# Patient Record
Sex: Female | Born: 1950 | Race: White | Hispanic: No | Marital: Married | Smoking: Never smoker
Health system: Southern US, Community
[De-identification: ages and names within clinical notes are randomized; demographics above are authoritative.]

## PROBLEM LIST (undated history)

## (undated) DIAGNOSIS — E119 Type 2 diabetes mellitus without complications: Secondary | ICD-10-CM

## (undated) DIAGNOSIS — I10 Essential (primary) hypertension: Principal | ICD-10-CM

## (undated) DIAGNOSIS — H269 Unspecified cataract: Secondary | ICD-10-CM

## (undated) DIAGNOSIS — E785 Hyperlipidemia, unspecified: Secondary | ICD-10-CM

## (undated) DIAGNOSIS — H409 Unspecified glaucoma: Secondary | ICD-10-CM

## (undated) HISTORY — DX: Unspecified cataract: H26.9

## (undated) HISTORY — DX: Essential (primary) hypertension: I10

## (undated) HISTORY — DX: Hyperlipidemia, unspecified: E78.5

## (undated) HISTORY — DX: Type 2 diabetes mellitus without complications: E11.9

## (undated) HISTORY — DX: Unspecified glaucoma: H40.9

---

## 2003-08-01 ENCOUNTER — Encounter: Admission: RE | Admit: 2003-08-01 | Discharge: 2003-08-01 | Payer: Self-pay | Admitting: Family Medicine

## 2004-07-03 ENCOUNTER — Ambulatory Visit: Payer: Self-pay | Admitting: Family Medicine

## 2004-07-28 ENCOUNTER — Ambulatory Visit: Payer: Self-pay | Admitting: Sports Medicine

## 2005-07-27 ENCOUNTER — Ambulatory Visit: Payer: Self-pay | Admitting: Family Medicine

## 2006-04-11 ENCOUNTER — Encounter (INDEPENDENT_AMBULATORY_CARE_PROVIDER_SITE_OTHER): Payer: Self-pay | Admitting: *Deleted

## 2006-04-22 ENCOUNTER — Encounter: Payer: Self-pay | Admitting: Family Medicine

## 2006-04-22 ENCOUNTER — Ambulatory Visit: Payer: Self-pay | Admitting: Family Medicine

## 2006-07-13 ENCOUNTER — Ambulatory Visit: Payer: Self-pay | Admitting: Sports Medicine

## 2006-12-10 ENCOUNTER — Encounter (INDEPENDENT_AMBULATORY_CARE_PROVIDER_SITE_OTHER): Payer: Self-pay | Admitting: *Deleted

## 2007-07-27 ENCOUNTER — Ambulatory Visit: Payer: Self-pay | Admitting: Family Medicine

## 2008-07-03 ENCOUNTER — Ambulatory Visit: Payer: Self-pay | Admitting: Family Medicine

## 2008-09-13 ENCOUNTER — Ambulatory Visit: Payer: Self-pay | Admitting: Family Medicine

## 2008-09-18 ENCOUNTER — Telehealth (INDEPENDENT_AMBULATORY_CARE_PROVIDER_SITE_OTHER): Payer: Self-pay | Admitting: Family Medicine

## 2009-05-30 ENCOUNTER — Telehealth: Payer: Self-pay | Admitting: Family Medicine

## 2009-05-30 ENCOUNTER — Ambulatory Visit: Payer: Self-pay | Admitting: Family Medicine

## 2009-05-30 DIAGNOSIS — I10 Essential (primary) hypertension: Secondary | ICD-10-CM

## 2009-05-30 DIAGNOSIS — I152 Hypertension secondary to endocrine disorders: Secondary | ICD-10-CM | POA: Insufficient documentation

## 2009-05-30 HISTORY — DX: Essential (primary) hypertension: I10

## 2009-07-17 ENCOUNTER — Ambulatory Visit: Payer: Self-pay | Admitting: Family Medicine

## 2009-07-29 ENCOUNTER — Telehealth: Payer: Self-pay | Admitting: Family Medicine

## 2009-07-30 ENCOUNTER — Ambulatory Visit: Payer: Self-pay | Admitting: Family Medicine

## 2010-07-18 ENCOUNTER — Ambulatory Visit: Payer: Self-pay | Admitting: Family Medicine

## 2010-11-13 NOTE — Assessment & Plan Note (Signed)
Summary: FLU SHOT/[facility]  Nurse Visit   Vital Signs:  Patient profile:   60 year old female Temp:     85 degrees F  Vitals Entered By: Theresia Lo RN (July 18, 2010 10:53 AM)  Allergies: 1)  ! Neomycin  Immunizations Administered:  Influenza Vaccine # 1:    Vaccine Type: Fluvax 3+    Site: left deltoid    Mfr: GlaxoSmithKline    Dose: 0.5 ml    Route: IM    Given by: Theresia Lo RN    Exp. Date: 04/08/2011    Lot #: [id number]    VIS given: 05/06/10 version given July 18, 2010.  Flu Vaccine Consent Questions:    Do you have a history of severe allergic reactions to this vaccine? no    Any prior history of allergic reactions to egg and/or gelatin? no    Do you have a sensitivity to the preservative Thimersol? no    Do you have a past history of Guillan-Barre Syndrome? no    Do you currently have an acute febrile illness? no    Have you ever had a severe reaction to latex? no    Vaccine information given and explained to patient? yes    Are you currently pregnant? no  Orders Added: 1)  Flu Vaccine 56yrs + [90658] 2)  Admin 1st Vaccine [84696]

## 2011-07-17 ENCOUNTER — Ambulatory Visit (INDEPENDENT_AMBULATORY_CARE_PROVIDER_SITE_OTHER): Payer: BC Managed Care – PPO

## 2011-07-17 DIAGNOSIS — Z23 Encounter for immunization: Secondary | ICD-10-CM

## 2012-07-21 ENCOUNTER — Ambulatory Visit (INDEPENDENT_AMBULATORY_CARE_PROVIDER_SITE_OTHER): Payer: BC Managed Care – PPO | Admitting: *Deleted

## 2012-07-21 DIAGNOSIS — Z23 Encounter for immunization: Secondary | ICD-10-CM

## 2012-12-30 ENCOUNTER — Telehealth: Payer: Self-pay | Admitting: Family Medicine

## 2012-12-30 NOTE — Telephone Encounter (Signed)
Patient is calling because when she took her shower and used a new washcloth.  After her shower, she used the bathroom and had burning with urination that she is sure is from the new cloth.  She wants to know what she can use otc to help with the irritation.

## 2012-12-30 NOTE — Telephone Encounter (Signed)
Returned call to patient.  States she used a "new, red" washcloth yesterday.  Washcloth felt "scratchy".  C/o area "where the pee comes out is irritated" when she wipes.  Denies any dysuria, vaginal discharge, bumps, or rash.  Area is not red or inflamed.  Only has pain when she wipes after urinating.  Yesterday area was swollen.  Patient did not use any soap or a washcloth on the area yesterday and today swelling is better.  Area "feels like it itches sometimes."  Patient has been wearing cotton panties.  Advised patient that washcloth may have caused some abrasion/irritation.  Avoid any harsh soaps, pat area dry after urinating, allow exposure to air if possible and irritation should resolve.  Call for appt or go to urgent care if area becomes red/inflamed/hot, or patient starts having dysuria or discharge.  Gaylene Brooks, RN

## 2013-05-15 ENCOUNTER — Other Ambulatory Visit: Payer: Self-pay

## 2013-05-15 DIAGNOSIS — Z1231 Encounter for screening mammogram for malignant neoplasm of breast: Secondary | ICD-10-CM

## 2013-05-29 ENCOUNTER — Ambulatory Visit
Admission: RE | Admit: 2013-05-29 | Discharge: 2013-05-29 | Disposition: A | Discharge status: Home / Self Care | Payer: BC Managed Care – PPO | Source: Ambulatory Visit

## 2013-05-29 DIAGNOSIS — Z1231 Encounter for screening mammogram for malignant neoplasm of breast: Secondary | ICD-10-CM

## 2013-06-01 ENCOUNTER — Encounter: Payer: Self-pay | Admitting: Family Medicine

## 2013-06-01 DIAGNOSIS — Z87898 Personal history of other specified conditions: Secondary | ICD-10-CM

## 2013-06-01 HISTORY — DX: Personal history of other specified conditions: Z87.898

## 2013-06-05 ENCOUNTER — Encounter: Payer: Self-pay | Admitting: Family Medicine

## 2013-06-05 ENCOUNTER — Ambulatory Visit (INDEPENDENT_AMBULATORY_CARE_PROVIDER_SITE_OTHER): Payer: BC Managed Care – PPO | Admitting: Family Medicine

## 2013-06-05 ENCOUNTER — Other Ambulatory Visit: Payer: Self-pay | Admitting: Family Medicine

## 2013-06-05 VITALS — BP 176/96 | HR 84 | Temp 98.0°F | Wt 144.0 lb

## 2013-06-05 DIAGNOSIS — R928 Other abnormal and inconclusive findings on diagnostic imaging of breast: Secondary | ICD-10-CM

## 2013-06-05 DIAGNOSIS — R739 Hyperglycemia, unspecified: Secondary | ICD-10-CM

## 2013-06-05 DIAGNOSIS — R7309 Other abnormal glucose: Secondary | ICD-10-CM

## 2013-06-05 DIAGNOSIS — R030 Elevated blood-pressure reading, without diagnosis of hypertension: Secondary | ICD-10-CM

## 2013-06-05 DIAGNOSIS — E785 Hyperlipidemia, unspecified: Secondary | ICD-10-CM

## 2013-06-05 DIAGNOSIS — I10 Essential (primary) hypertension: Secondary | ICD-10-CM

## 2013-06-05 DIAGNOSIS — Z0000 Encounter for general adult medical examination without abnormal findings: Secondary | ICD-10-CM

## 2013-06-05 LAB — BASIC METABOLIC PANEL
BUN: 9 mg/dL (ref 6–23)
Chloride: 99 mEq/L (ref 96–112)
Creat: 0.71 mg/dL (ref 0.50–1.10)
Glucose, Bld: 267 mg/dL — ABNORMAL HIGH (ref 70–99)
Potassium: 4.1 mEq/L (ref 3.5–5.3)

## 2013-06-05 MED ORDER — AMLODIPINE BESYLATE 5 MG PO TABS: 5.0000 mg | ORAL_TABLET | Freq: Every day | ORAL | Status: DC

## 2013-06-05 MED ORDER — INDAPAMIDE 1.25 MG PO TABS: 1.2500 mg | ORAL_TABLET | ORAL | Status: DC

## 2013-06-05 NOTE — Patient Instructions (Addendum)
Your blood pressure is in the Hypertension range. Start two low dose antihypertensive medications: a thiazide diuretic called indapamide and a clacium channel blocker called Amlodipine.  We may need to raise the doses of the medications in the future to reach our goal of BP < 150/90.   Today we are checking your blood sugars, electrolytes, kidney function and cholesterol.   Return in 4 weeks to assess blood pressure control  Keep a blood pressure diary to bring to your next office visit.   Go for a dilated eye exam to look for hypertensive retinopathy.

## 2013-06-06 ENCOUNTER — Telehealth: Payer: Self-pay | Admitting: Family Medicine

## 2013-06-06 ENCOUNTER — Encounter: Payer: Self-pay | Admitting: Family Medicine

## 2013-06-06 DIAGNOSIS — R739 Hyperglycemia, unspecified: Secondary | ICD-10-CM

## 2013-06-06 DIAGNOSIS — E7800 Pure hypercholesterolemia, unspecified: Secondary | ICD-10-CM

## 2013-06-06 DIAGNOSIS — E785 Hyperlipidemia, unspecified: Secondary | ICD-10-CM

## 2013-06-06 DIAGNOSIS — E1169 Type 2 diabetes mellitus with other specified complication: Secondary | ICD-10-CM | POA: Insufficient documentation

## 2013-06-06 DIAGNOSIS — Z0000 Encounter for general adult medical examination without abnormal findings: Secondary | ICD-10-CM | POA: Insufficient documentation

## 2013-06-06 HISTORY — DX: Hyperlipidemia, unspecified: E78.5

## 2013-06-06 MED ORDER — ATORVASTATIN CALCIUM 40 MG PO TABS: 40.0000 mg | ORAL_TABLET | Freq: Every day | ORAL | Status: DC

## 2013-06-06 NOTE — Telephone Encounter (Signed)
I spoke with Stefanie Wagner.  We discussed the elevated LDL and elevated random serum glucose.  She agreed to trial of statin and to coming by [facility] lab for an A1C when she brings her dgt Melissa in for office visit.  Will call in Rx for atorvastatin 40 mg daily and enter order for A1C on 06/15/13.

## 2013-06-06 NOTE — Assessment & Plan Note (Addendum)
New diagnosis. No clinical or historical evidence of end organ damage.  Will check BMET for glucose and GFR.  Will check Lipids to risk stratify for CV events.  Recommendations - DASH diet - Start Indapamide 1.25 mg daily and Start Amlodipine 5 mg daily. - Follow up screening labs. - Will check ECG next visit to look for cardiac end organ changes.  - Start home BP monitoring and bring with her to next office visit.  - Goal BP < 150/90 on average.

## 2013-06-06 NOTE — Assessment & Plan Note (Addendum)
Will attempt to reach patient at home telephone number in order to request she come in for diagnostic A1C lab

## 2013-06-06 NOTE — Progress Notes (Signed)
  Subjective:    Patient ID: Stefanie Wagner, female    DOB: 12/24/1950, 62 y.o.   MRN: [id number]  HPI  Elevated BP   Blood pressure range: not checking at home  Chest pain: no   Dyspnea: no   Claudication: no   Medication compliance: Taking no medications, including OTC medications  Preventitive Healthcare:  Exercise: no, though active   Diet Pattern: no formal plan  Salt Restriction: no  ROS: No polyuria, No polydipsia   Review of Systems See HPI  PMH: History of "White coat" blood pressure elevations           No smoking history      Objective:   Physical Exam  Constitutional: She appears well-developed and well-nourished. No distress (Appears slightly anxious).  HENT:  Right Ear: Hearing, tympanic membrane, external ear and ear canal normal. No decreased hearing is noted.  Left Ear: Hearing, tympanic membrane, external ear and ear canal normal. No decreased hearing is noted.  Neck: Full passive range of motion without pain. Neck supple. Normal carotid pulses, no hepatojugular reflux and no JVD present. Carotid bruit is not present. No mass and no thyromegaly present.  Cardiovascular: Normal rate, regular rhythm, normal heart sounds and normal pulses.   Pulses:      Dorsalis pedis pulses are 2+ on the right side, and 2+ on the left side.  Pulmonary/Chest: Effort normal and breath sounds normal.  Abdominal: Soft. Normal aorta and bowel sounds are normal. There is no hepatosplenomegaly. There is no tenderness.  Lymphadenopathy:    She has no cervical adenopathy.  Neurological: She is alert.  Psychiatric: Her behavior is normal. Thought content normal. Her mood appears anxious (mild). Her speech is not rapid and/or pressured. Cognition and memory are normal.          Assessment & Plan:

## 2013-06-06 NOTE — Assessment & Plan Note (Signed)
Discuss CRC screening, Pap smear screening and Zostavax  At next office visit in 4 weeks.

## 2013-06-15 ENCOUNTER — Other Ambulatory Visit (INDEPENDENT_AMBULATORY_CARE_PROVIDER_SITE_OTHER): Payer: BC Managed Care – PPO

## 2013-06-15 ENCOUNTER — Other Ambulatory Visit (INDEPENDENT_AMBULATORY_CARE_PROVIDER_SITE_OTHER): Payer: BC Managed Care – PPO | Admitting: Family Medicine

## 2013-06-15 ENCOUNTER — Ambulatory Visit (INDEPENDENT_AMBULATORY_CARE_PROVIDER_SITE_OTHER): Payer: BC Managed Care – PPO | Admitting: *Deleted

## 2013-06-15 DIAGNOSIS — R739 Hyperglycemia, unspecified: Secondary | ICD-10-CM

## 2013-06-15 DIAGNOSIS — Z23 Encounter for immunization: Secondary | ICD-10-CM

## 2013-06-15 DIAGNOSIS — R7309 Other abnormal glucose: Secondary | ICD-10-CM

## 2013-06-15 DIAGNOSIS — E119 Type 2 diabetes mellitus without complications: Secondary | ICD-10-CM

## 2013-06-15 DIAGNOSIS — E1165 Type 2 diabetes mellitus with hyperglycemia: Secondary | ICD-10-CM | POA: Insufficient documentation

## 2013-06-15 HISTORY — DX: Type 2 diabetes mellitus without complications: E11.9

## 2013-06-15 LAB — POCT GLYCOSYLATED HEMOGLOBIN (HGB A1C): Hemoglobin A1C: 10.2

## 2013-06-15 NOTE — Progress Notes (Signed)
A1c done = 10.2 % BAJORDAN, MLS

## 2013-06-21 ENCOUNTER — Telehealth: Payer: Self-pay | Admitting: Family Medicine

## 2013-06-21 NOTE — Telephone Encounter (Signed)
Pt informed. Fleeger, Jessica Dawn  

## 2013-06-21 NOTE — Telephone Encounter (Signed)
Patient calls to let Dr. McDiarmid know that she is walking 30 mins everyday except Sundays. Also, she has had her eye exam and there is no damage.

## 2013-06-21 NOTE — Assessment & Plan Note (Signed)
New diagnosis of DMT2.  Pt wants to try lifestyle management initially.  Will recheck A1C in 3 months.  Pt to attend Diabetes and nutrition management in interim.

## 2013-06-21 NOTE — Telephone Encounter (Signed)
Pt also wants to clarify when she is to come back and see MD.  She thinks that it is 2 months but wants to verify. Fleeger, Maryjo Rochester

## 2013-06-21 NOTE — Telephone Encounter (Signed)
Ask Stefanie Wagner to schedule an appointment with McDiarmid in Early December for follow up of her diabetes and hypertension.

## 2013-06-26 ENCOUNTER — Other Ambulatory Visit: Payer: Self-pay | Admitting: Family Medicine

## 2013-06-26 ENCOUNTER — Ambulatory Visit
Admission: RE | Admit: 2013-06-26 | Discharge: 2013-06-26 | Disposition: A | Discharge status: Home / Self Care | Payer: BC Managed Care – PPO | Source: Ambulatory Visit | Attending: Family Medicine | Admitting: Family Medicine

## 2013-06-26 DIAGNOSIS — R928 Other abnormal and inconclusive findings on diagnostic imaging of breast: Secondary | ICD-10-CM

## 2013-06-26 DIAGNOSIS — R921 Mammographic calcification found on diagnostic imaging of breast: Secondary | ICD-10-CM

## 2013-06-30 ENCOUNTER — Ambulatory Visit
Admission: RE | Admit: 2013-06-30 | Discharge: 2013-06-30 | Disposition: A | Discharge status: Home / Self Care | Payer: BC Managed Care – PPO | Source: Ambulatory Visit | Attending: Family Medicine | Admitting: Family Medicine

## 2013-06-30 DIAGNOSIS — R921 Mammographic calcification found on diagnostic imaging of breast: Secondary | ICD-10-CM

## 2013-07-04 ENCOUNTER — Encounter: Payer: BC Managed Care – PPO | Attending: Family Medicine

## 2013-07-04 VITALS — Ht 66.0 in | Wt 136.0 lb

## 2013-07-04 DIAGNOSIS — E119 Type 2 diabetes mellitus without complications: Secondary | ICD-10-CM | POA: Insufficient documentation

## 2013-07-04 DIAGNOSIS — Z713 Dietary counseling and surveillance: Secondary | ICD-10-CM | POA: Insufficient documentation

## 2013-07-06 ENCOUNTER — Ambulatory Visit: Payer: BC Managed Care – PPO

## 2013-07-07 NOTE — Progress Notes (Signed)
  Patient was seen on 07/04/13 for the first of a series of three diabetes self-management courses at [facility]. The following learning objectives were met by the patient during this course:   Defines the role of glucose and insulin  Identifies type of diabetes and pathophysiology  Defines the diagnostic criteria for diabetes and prediabetes  States the risk factors for Type 2 Diabetes  States the symptoms of Type 2 Diabetes  Defines Type 2 Diabetes treatment goals  Defines Type 2 Diabetes treatment options  States the rationale for glucose monitoring  Identifies A1C, glucose targets, and testing times  Identifies proper sharps disposal  Defines the purpose of a diabetes food plan  Identifies carbohydrate food groups  Defines effects of carbohydrate foods on glucose levels  Identifies carbohydrate choices/grams/food labels  States benefits of physical activity and effect on glucose  Review of suggested activity guidelines  Handouts given during class include:  Type 2 Diabetes: Basics Book  My Food Plan Book  Food and Activity Log   Follow-Up Plan: Attend core 2 & 3

## 2013-07-11 NOTE — Progress Notes (Signed)
  Patient was seen on 07/11/13 for the second of a series of three diabetes self-management courses at the [facility]. The following learning objectives were met by the patient during this course:   Explain basic nutrition maintenance and quality assurance  Describe causes, symptoms and treatment of hypoglycemia and hyperglycemia  Explain how to manage diabetes during illness  Describe the importance of good nutrition for health and healthy eating strategies  List strategies to follow meal plan when dining out  Describe the effects of alcohol on glucose and how to use it safely  Describe problem solving skills for day-to-day glucose challenges  Describe strategies to use when treatment plan needs to change  Identify important factors involved in successful weight loss  Describe ways to remain physically active  Describe the impact of regular activity on insulin resistance   Follow-Up Plan: Patient will attend the final class of the ADA Diabetes Self-Care Education.    

## 2013-07-19 ENCOUNTER — Encounter: Payer: BC Managed Care – PPO | Attending: Family Medicine

## 2013-07-19 DIAGNOSIS — Z713 Dietary counseling and surveillance: Secondary | ICD-10-CM | POA: Insufficient documentation

## 2013-07-19 DIAGNOSIS — E119 Type 2 diabetes mellitus without complications: Secondary | ICD-10-CM | POA: Insufficient documentation

## 2013-07-19 NOTE — Progress Notes (Signed)
Patient was seen on 07/19/13 for the third of a series of three diabetes self-management courses at [facility]. The following learning objectives were met by the patient during this course:    Describe how diabetes changes over time   Identify diabetes complications and ways to prevent them   Describe strategies that can promote heart health including lowering blood pressure and cholesterol   Describe strategies to lower dietary fat and sodium in the diet   Identify physical activities that benefit cardiovascular health   Describe role of stress on blood glucose and develop strategies to address psychosocial issues   Evaluate success in meeting personal goal   Describe the belief that they can live successfully with diabetes day to day   Establish 2-3 goals that they will plan to diligently work on until they return for the free 19-month follow-up visit  The following handouts were given in class:  Goal setting handout  Class evaluation form  Low-sodium seasoning tips  Stress management handout  Your patient has established the following 4 month goals for diabetes self-care:  Reduce fat in my diet  To help manage stress, I will read at least 3 times a week  Your patient has identified these potential barriers to change:  Taking care of handicapped daughter  Remembering to take medication  Your patient has identified their diabetes self-care support plan as:  [facility] support group  family   Follow-Up Plan: Patient was offered a 4 month follow-up visit for diabetes self-management education.

## 2013-07-20 ENCOUNTER — Ambulatory Visit: Payer: BC Managed Care – PPO

## 2013-07-27 ENCOUNTER — Ambulatory Visit: Payer: BC Managed Care – PPO

## 2013-09-12 ENCOUNTER — Encounter: Payer: Self-pay | Admitting: Family Medicine

## 2013-09-12 ENCOUNTER — Ambulatory Visit (INDEPENDENT_AMBULATORY_CARE_PROVIDER_SITE_OTHER): Payer: BC Managed Care – PPO | Admitting: Family Medicine

## 2013-09-12 VITALS — BP 142/80 | HR 77 | Ht 66.0 in | Wt 123.5 lb

## 2013-09-12 DIAGNOSIS — I10 Essential (primary) hypertension: Secondary | ICD-10-CM

## 2013-09-12 DIAGNOSIS — E7800 Pure hypercholesterolemia, unspecified: Secondary | ICD-10-CM

## 2013-09-12 DIAGNOSIS — R030 Elevated blood-pressure reading, without diagnosis of hypertension: Secondary | ICD-10-CM

## 2013-09-12 DIAGNOSIS — R928 Other abnormal and inconclusive findings on diagnostic imaging of breast: Secondary | ICD-10-CM

## 2013-09-12 DIAGNOSIS — Z0000 Encounter for general adult medical examination without abnormal findings: Secondary | ICD-10-CM

## 2013-09-12 LAB — POCT GLYCOSYLATED HEMOGLOBIN (HGB A1C): Hemoglobin A1C: 6.2

## 2013-09-12 MED ORDER — ZOSTER VACCINE LIVE 19400 UNT/0.65ML ~~LOC~~ SOLR: 0.6500 mL | Freq: Once | SUBCUTANEOUS | Status: DC

## 2013-09-12 MED ORDER — INDAPAMIDE 1.25 MG PO TABS: 1.2500 mg | ORAL_TABLET | ORAL | Status: DC

## 2013-09-12 MED ORDER — ATORVASTATIN CALCIUM 40 MG PO TABS: 40.0000 mg | ORAL_TABLET | Freq: Every day | ORAL | Status: DC

## 2013-09-12 NOTE — Progress Notes (Signed)
Patient ID: Stefanie Wagner, female   DOB: May 09, 1951, 62 y.o.   MRN: [id number]  Stefanie Wagner is a 62 y.o. female who presents to [facility] today for follow-up of HTN and DM.  Patient reports she is feeling well and has no side effects to her medications.  She feels she has been successful in working with the dietician to exercise more and eat healthier.  The following portions of the patient's history were reviewed and updated as appropriate: allergies, current medications, past medical history, family and social history, and problem list.  Patient is a nonsmoker and does not drink.  Past Medical History  Diagnosis Date  . Essential hypertension, benign 05/30/2009    Qualifier: Diagnosis of  By: McDiarmid MD, Tawanna Cooler    . Hyperlipidemia LDL goal < 100 06/06/2013  . Diabetes mellitus without complication     ROS as above otherwise neg.    Medications reviewed. Current Outpatient Prescriptions  Medication Sig Dispense Refill  . amLODipine (NORVASC) 5 MG tablet Take 1 tablet (5 mg total) by mouth daily.  30 tablet  11  . atorvastatin (LIPITOR) 40 MG tablet Take 1 tablet (40 mg total) by mouth daily.  30 tablet  5  . indapamide (LOZOL) 1.25 MG tablet Take 1 tablet (1.25 mg total) by mouth every morning.  30 tablet  3   No current facility-administered medications for this visit.    Exam:  BP 142/80  Pulse 77  Ht 5\' 6"  (1.676 m)  Wt 123 lb 8 oz (56.019 kg)  BMI 19.94 kg/m2 Gen: Well, NAD HEENT: EOMI,  MMM Lungs: CTABL, no WOB Heart: RRR Abd: NABS, NT, ND Exts: Non edematous BL  LE, warm and well perfused.   No results found for this or any previous visit (from the past 72 hour(s)).  Assessment Stefanie Wagner is a 62 y.o. Female who is controlled with her blood pressure and well controlled with lifestyle modifications for her blood sugars.  Plan HTN: -Continue current medications and diet  DM:  -A1C is now 6.2, plan to continue current lifestyle modifications  Healthcare  maintenance: -Patient declined colonoscopy, was informed about FOBT as well -Meds have been refilled -Plan to follow up with PCP in 6 months -Will mail Zostavax prescription to patient  Attending Addendum  I examined the patient and discussed the assessment and plan with Student Dr. Byrd Hesselbach. I have reviewed the note and agree.  Briefly, 62 yo F f/u for:  1. DM2:  Meds: compliant with high dose statin. No ASA.  Diet: compliant. Completed diabetes eduction classes.  ROS: neg for HA, vision changes, CP, SOB, polydipsia, polyuria, tingling/numbness in extremities Eye: followed by opthalmology has known cataracts (early) and glaucoma.  Foot: done w/in last year  2. HTN: compliant with thiazide diuretic and Norvasc.  ROS: as per above.  Diet: low salt and caffeine.   3. Health care maintenance: patient has discussed pap smear with her PCP and declines additional paps given that her last pap was normal and she is low risk (no STDs, one long term sex partner, nonsmoker, HPV negative). Discussed colon cancer screening, negative family history. No melena, stool changes or hematochezia. Patient declines. Discussed zostavax. Patient will consider. Has hx of neomycin allergy, skin rash only.   BP 142/80  Pulse 77  Ht 5\' 6"  (1.676 m)  Wt 123 lb 8 oz (56.019 kg)  BMI 19.94 kg/m2 General appearance: alert, cooperative and no distress Neck: no adenopathy, no carotid bruit, no JVD, supple,  symmetrical, trachea midline and thyroid not enlarged, symmetric, no tenderness/mass/nodules Lungs: clear to auscultation bilaterally Heart: regular rate and rhythm, S1, S2 normal, no murmur, click, rub or gallop Extremities: extremities normal, atraumatic, no cyanosis or edema    Dessa Phi, MD [facility]

## 2013-09-12 NOTE — Patient Instructions (Addendum)
It was a pleasure to meet you today, Stefanie Wagner.  Great work with lowering your blood sugars, continue the excellent work with your diet and exercise!  We have refilled your medications.  Follow up with Dr. McDiarmid in 6 months.

## 2013-09-13 NOTE — Assessment & Plan Note (Signed)
A: well controlled Meds: compliant P:  Continue current regimen Repeat CMP yearly, next 05/2014.

## 2013-09-13 NOTE — Assessment & Plan Note (Signed)
A: improved with lifestyle modifictions. Asymptomatic.  P:  F/u A1c in 6 months Eye exams per ophthalmologist given known glaucoma and cataracts.  Consider ASA therapy, may not be needed given excellent control  Continue statin  F/u appt in 6 months

## 2013-09-13 NOTE — Assessment & Plan Note (Signed)
zostavax handout given. Patient's mother had shingles so she is aware of disease.

## 2013-09-13 NOTE — Assessment & Plan Note (Signed)
A: Appropriate f/u done as per overview. Benign findings.  P: repeat screening mammogram in one year 8-06/2014

## 2013-09-15 ENCOUNTER — Telehealth: Payer: Self-pay | Admitting: Family Medicine

## 2013-09-15 NOTE — Telephone Encounter (Signed)
Dr Armen Pickup wants her to have a shingles shot Pharmacy: [facility]   [address]

## 2013-09-15 NOTE — Telephone Encounter (Signed)
Please call in authorization for Zostavax to patient's pharmacy.  ICD-9 V04.89 is indication.

## 2013-09-15 NOTE — Telephone Encounter (Signed)
Zostavax called into pharmacy to [facility]. Pt is aware. Irelyn Perfecto,CMA

## 2013-09-27 ENCOUNTER — Telehealth: Payer: Self-pay | Admitting: *Deleted

## 2013-11-21 ENCOUNTER — Telehealth: Payer: Self-pay | Admitting: *Deleted

## 2013-11-21 ENCOUNTER — Ambulatory Visit: Payer: BC Managed Care – PPO | Admitting: *Deleted

## 2014-01-30 NOTE — Telephone Encounter (Signed)
Telephone encounter.

## 2014-01-30 NOTE — Telephone Encounter (Signed)
Phone encounter

## 2014-03-27 ENCOUNTER — Other Ambulatory Visit: Payer: Self-pay | Admitting: *Deleted

## 2014-03-27 DIAGNOSIS — R030 Elevated blood-pressure reading, without diagnosis of hypertension: Secondary | ICD-10-CM

## 2014-03-27 MED ORDER — INDAPAMIDE 1.25 MG PO TABS: 1.2500 mg | ORAL_TABLET | ORAL | Status: DC

## 2014-04-26 ENCOUNTER — Ambulatory Visit (INDEPENDENT_AMBULATORY_CARE_PROVIDER_SITE_OTHER): Payer: BC Managed Care – PPO | Admitting: Family Medicine

## 2014-04-26 ENCOUNTER — Encounter: Payer: Self-pay | Admitting: Family Medicine

## 2014-04-26 VITALS — BP 140/74 | HR 72 | Ht 66.0 in | Wt 123.0 lb

## 2014-04-26 DIAGNOSIS — IMO0001 Reserved for inherently not codable concepts without codable children: Secondary | ICD-10-CM

## 2014-04-26 DIAGNOSIS — E785 Hyperlipidemia, unspecified: Secondary | ICD-10-CM

## 2014-04-26 DIAGNOSIS — Z1159 Encounter for screening for other viral diseases: Secondary | ICD-10-CM

## 2014-04-26 DIAGNOSIS — E1165 Type 2 diabetes mellitus with hyperglycemia: Principal | ICD-10-CM

## 2014-04-26 DIAGNOSIS — H409 Unspecified glaucoma: Secondary | ICD-10-CM

## 2014-04-26 DIAGNOSIS — H269 Unspecified cataract: Secondary | ICD-10-CM

## 2014-04-26 DIAGNOSIS — IMO0002 Reserved for concepts with insufficient information to code with codable children: Secondary | ICD-10-CM

## 2014-04-26 DIAGNOSIS — I10 Essential (primary) hypertension: Secondary | ICD-10-CM

## 2014-04-26 HISTORY — DX: Unspecified cataract: H26.9

## 2014-04-26 HISTORY — DX: Unspecified glaucoma: H40.9

## 2014-04-26 LAB — LDL CHOLESTEROL, DIRECT: LDL DIRECT: 78 mg/dL

## 2014-04-26 LAB — POCT GLYCOSYLATED HEMOGLOBIN (HGB A1C): Hemoglobin A1C: 6.3

## 2014-04-26 NOTE — Patient Instructions (Signed)
Your Blood sugar is under great control.   Your blood pressure is under good control.  Keep taking your blood pressure medications.  Start taking an Aspirin 81 mg (baby) daily to prevent stroke.  We are checking your cholesterol and Hepatitis C antibodies.  Try Replens, a vaginal moisturizer.  It is available over-the-counter at most drug stores.  Consider use of a lubricant, like KY jelly, prior to sexual intercourse to ease penetration.  Apply to opening of vaginal and to penile shaft.   If pain with intercourse continues, Dr Cassey Bacigalupo would like to see you again to due a further evaluation.   Contact Dr Spero GeraldsMichelle Kane to discuss brief couples counseling.  Dyspareunia Dyspareunia is pain during sexual intercourse. It is most common in women, but it also happens in men.  CAUSES  Female The pain from this condition is usually felt when anything is put into the vagina, but any part of the genitals may cause pain during sex. Even sitting or wearing pants can cause pain. Sometimes, a cause cannot be found. Some causes of pain during intercourse are:  Infections of the skin around the vagina.  Vaginal infections, such as a yeast, bacterial, or viral infection.  Vaginismus. This is the inability to have anything put in the vagina even when the woman wants it to happen. There is an automatic muscle contraction and pain. The pain of the muscle contraction can be so severe that intercourse is impossible.  Allergic reaction from spermicides, semen, condoms, scented tampons, soaps, douches, and vaginal sprays.  A fluid-filled sac (cyst) on the Bartholin or Skene glands, located at the opening of the vagina.  Scar tissue in the vagina from a surgically enlarged opening (episiotomy) or tearing after delivering a baby.  Vaginal dryness. This is more common in menopause. The normal secretions of the vagina are decreased. Changes in estrogen levels and increased difficulty becoming aroused can cause  painful sex. Vaginal dryness can also happen when taking birth control pills.  Thinning of the tissue (atrophy) of the vulva and vagina. This makes the area thinner, smaller, unable to stretch to accommodate a penis, and prone to infection and tearing.  Vulvar vestibulitis or vestibulodynia.This is a condition that causes pain involving the area around the entrance to the vagina.The most common cause in young women is birth control pills.Women with low estrogen levels (postmenopausal women) may also experience this.Other causes include allergic reactions, too many nerve endings, skin conditions, and pelvic muscles that cannot relax.  Vulvar dermatoses. This includes skin conditions such as lichen sclerosus and lichen planus.  Lack of foreplay to lubricate the vagina. This can cause vaginal dryness.  Noncancerous tumors (fibroids) in the uterus.  Uterus lining tissue growing outside the uterus (endometriosis).  Pregnancy that starts in the fallopian tube (tubal pregnancy).  Pregnancy or breastfeeding your baby. This can cause vaginal dryness.  A tilting or prolapse of the uterus. Prolapse is when weak and stretched muscles around the uterus allow it to fall into the vagina.  Problems with the ovaries, cysts, or scar tissue. This may be worse with certain sexual positions.  Previous surgeries causing adhesions or scar tissue in the vagina or pelvis.  Bladder and intestinal problems.  Psychological problems (such as depression or anxiety). This may make pain worse.  Negative attitudes about sex, experiencing rape, sexual assault, and misinformation about sex. These issues are often related to some types of pain.  Previous pelvic infection, causing scar tissue in the pelvis and on the  female organs.  Cyst or tumor on the ovary.  Cancer of the female organs.  Certain medicines.  Medical problems such as diabetes, arthritis, or thyroid disease. Female In men, there are many  physical causes of sexual discomfort. Some causes of pain during intercourse are:  Infections of the prostate, bladder, or seminal vesicles. This can cause pain after ejaculation.  An inflamed bladder (interstitial cystitis). This may cause pain from ejaculation.  Gonorrheal infections. This may cause pain during ejaculation.  An inflamed urethra (urethritis) or inflamed prostate (prostatitis). This can make genital stimulation painful or uncomfortable.  Deformities of the penis, such as Peyronie's disease.  A tight foreskin.  Cancer of the female organs.  Psychological problems. This may make pain worse. DIAGNOSIS   Your caregiver will take a history and have you describe where the pain is located (outside the vagina, in the vagina, in the pelvis). You may be asked when you experience pain, such as with penetration or with thrusting.  Following this, your caregiver will do a physical exam. Let your caregiver know if the exam is too painful.  During the final part of the female exam, your caregiver will feel your uterus and ovaries with one hand on the abdomen and one finger in your vagina. This is a pelvic exam.  Blood tests, a Pap test, cultures for infection, an ultrasound test, and X-rays may be done. You may need to see a specialist for female problems (gynecologist).  Your caregiver may do a CT scan, MRI, or laparoscopy. Laparoscopy is a procedure to look into the pelvis with a lighted tube, through a cut (incision) in the abdomen. TREATMENT  Your caregiver can help you determine the best course of treatment. Sometimes, more testing is done. Continue with the suggested testing until your caregiver feels sure about your diagnosis and how to treat it. Sometimes, it is difficult to find the reason for the pain. The search for the cause and treatment can be frustrating. Treatment often takes several weeks to a few months before you notice any improvement. You may also need to avoid  sexual activity until symptoms improve.Continuing to have sex when it hurts can delay healing and actually make the problem worse. The treatment depends on the cause of the pain. Treatment may include:  Medicines such as antibiotics, vaginal or skin creams, hormones, or antidepressants.  Minor or major surgery.  Psychological counseling or group therapy.  Kegel exercises and vaginal dilators to help certain cases of vaginismus (spasms). Do this only if recommended by your caregiver.Kegel exercises can make some problems worse.  Applying lubrication as recommended by your caregiver if you have dryness.  Sex therapy for you and your sex partner. It is common for the pain to continue after the reason for the pain has been treated. Some reasons for this include a conditioned response. This means the person having the pain becomes so familiar with the pain that the pain continues as a response, even though the cause is removed. Sex therapy can help with this problem. HOME CARE INSTRUCTIONS   Follow your caregiver's instructions about taking medicines, tests, counseling, and follow-up treatment.  Do not use scented tampons, douches, vaginal sprays, or soaps.  Use water-based lubricants for dryness. Oil lubricants can cause irritation.  Do not use spermicides or condoms that irritate you.  Openly discuss with your partner your sexual experience, your desires, foreplay, and different sexual positions for a more comfortable and enjoyable sexual relationship.  Join group sessions for therapy,  if needed.  Practice safe sex at all times.  Empty your bladder before having intercourse.  Try different positions during sexual intercourse.  Take over-the-counter pain medicine recommended by your caregiver before having sexual intercourse.  Do not wear pantyhose. Knee-high and thigh-high hose are okay.  Avoid scrubbing your vulva with a washcloth. Wash the area gently and pat dry with a  towel. SEEK MEDICAL CARE IF:   You develop vaginal bleeding after sexual intercourse.  You develop a lump at the opening of your vagina, even if it is not painful.  You have abnormal vaginal discharge.  You have vaginal dryness.  You have itching or irritation of the vulva or vagina.  You develop a rash or reaction to your medicine. SEEK IMMEDIATE MEDICAL CARE IF:   You develop severe abdominal pain during or shortly after sexual intercourse. You could have a ruptured ovarian cyst or ruptured tubal pregnancy.  You have a fever.  You have painful or bloody urination.  You have painful sexual intercourse, and you never had it before.  You pass out after having sexual intercourse. Document Released: 10/18/2007 Document Revised: 12/21/2011 Document Reviewed: 12/29/2010 [facility] Patient Information 2015 [place], [place]. This information is not intended to replace advice given to you by your health care provider. Make sure you discuss any questions you have with your health care provider.

## 2014-04-27 ENCOUNTER — Encounter: Payer: Self-pay | Admitting: Family Medicine

## 2014-04-27 DIAGNOSIS — IMO0002 Reserved for concepts with insufficient information to code with codable children: Secondary | ICD-10-CM | POA: Insufficient documentation

## 2014-04-27 LAB — HEPATITIS C ANTIBODY: HCV AB: NEGATIVE

## 2014-04-27 NOTE — Progress Notes (Signed)
   Subjective:    Patient ID: Stefanie Wagner, female    DOB: 26-Sep-1951, 63 y.o.   MRN: [id number] Patient is accompanied by her husband for History and physical. Patient and EMR are sources of information for the visit.  HPI HYPERTENSION  Disease Monitoring: Blood pressure range-not checking at home Chest pain- no      Dyspnea- no  Medications: Compliance- taking amlodipine and Indapamide as recommended Lightheadedness- no   Edema- no   DIABETES  Disease Monitoring: Blood Sugar ranges- in low 100s fasting Polyuria- no New Visual problems- Patient diagnosed with glaucoma and early cataracts by Dr Harlon FlorWhitaker (ophth).  She saw him in 10/2013.  To see again next month.   No numbness in feet.  No     HYPERLIPIDEMIA  Disease Monitoring: See symptoms for Hypertension  Medications: Compliance- yes, takinge atorvastatin as recommended RUQ pain- no  Muscle aches- no  Dyspareunia - Onset several years ago, after menopause in her mid-50s - No history of abnormal paps.  Has had only one sexual partner, her current husband.  - Pt and partner have refrained from intercourse because of pt's pain  - Both pain with initial penile penetration and during thrusting. - Pt does not feel that she  - No vaginal discharge.  No dysuria.  - No vaginal bleeding. No pain without sexual intercourse.  - Pain is severe enough to stop intercourse.   - Pt reports decreased lower genital secretion around sexual relations. - No use of lubricants around sexual relations - Pt perceives avoidance of intercourse has been a relationship strain in her marriage.  - She would like to resume sexual intercourse, if possible.    ROS See HPI above   PMH Smoking Status noted    Review of Systems See HPI      Objective:   Physical Exam VS reviewed GEN: Alert, Cooperative, Groomed, NAD COR: RRR, No M/G/R, No JVD, Normal PMI size and location LUNGS: BCTA, No Acc mm use, speaking in full sentences  EXT: No  peripheral leg edema. Feet without deformity or lesions. Palpable bilateral pedal pulses.  Diabetic Foot Check -  Appearance - no lesions, ulcers or calluses Skin - no unusual pallor or redness  Diabetic Foot exam Monofilament testing -  Right - Great toe, medial, central, lateral ball and posterior foot intact Left - Great toe, medial, central, lateral ball and posterior foot intact Neuro: Oriented to person, place, and time; Strength: 5/5 Bil. UE and LE symmetric; Sensation: Intact grossly to touch all four extremities;  Gait: Normal speed, No significant path deviation, Step through +,  Psych: Normal affect/thought/speech/language       Assessment & Plan:

## 2014-04-27 NOTE — Assessment & Plan Note (Signed)
Adequate lipid control. Tolerating medications.  No new end-organ damage.  Continue current medications, atorvastatin 40 mg daily

## 2014-04-27 NOTE — Assessment & Plan Note (Signed)
New problem. Currently, suspect dyspareunia related to menopausal and age related decline in vaginal secretion with sexual relations.  Recommend trial of vaginal mucosa emollient, e.g. Replens, and use of water-based lubricant applied to introitus prior to intercourse. If problem persists, will need to perform a pelvic exam.

## 2014-04-27 NOTE — Assessment & Plan Note (Signed)
Adequate glycemic control with lifestyle interventions alone.  No new end-organ damage.  Continue lifestyle management.

## 2014-04-27 NOTE — Assessment & Plan Note (Signed)
Adequate blood pressure control.  No evidence of new end organ damage.  Tolerating medication without significant adverse effects.  Plan to continue current blood pressure regiment.   

## 2014-05-22 ENCOUNTER — Other Ambulatory Visit: Payer: Self-pay

## 2014-05-22 DIAGNOSIS — Z1231 Encounter for screening mammogram for malignant neoplasm of breast: Secondary | ICD-10-CM

## 2014-05-23 ENCOUNTER — Ambulatory Visit (INDEPENDENT_AMBULATORY_CARE_PROVIDER_SITE_OTHER): Payer: BC Managed Care – PPO | Admitting: *Deleted

## 2014-05-23 ENCOUNTER — Other Ambulatory Visit: Payer: Self-pay | Admitting: Family Medicine

## 2014-05-23 DIAGNOSIS — Z23 Encounter for immunization: Secondary | ICD-10-CM

## 2014-05-30 ENCOUNTER — Ambulatory Visit
Admission: RE | Admit: 2014-05-30 | Discharge: 2014-05-30 | Disposition: A | Discharge status: Home / Self Care | Payer: BC Managed Care – PPO | Source: Ambulatory Visit

## 2014-05-30 ENCOUNTER — Telehealth: Payer: Self-pay | Admitting: Family Medicine

## 2014-05-30 DIAGNOSIS — Z1231 Encounter for screening mammogram for malignant neoplasm of breast: Secondary | ICD-10-CM

## 2014-05-30 NOTE — Telephone Encounter (Signed)
Pt called and would like another prescription for her shingle shot. She misplaced the other one. Can we fax this too [phone] which is her personal fax.jw

## 2014-05-30 NOTE — Telephone Encounter (Signed)
Pt would like another prescription for a shingle shot. She has misplaced the original. Please fax this to her home at [phone]. Myriam Jacobsonjw

## 2014-06-01 NOTE — Telephone Encounter (Signed)
Pt informed

## 2014-06-01 NOTE — Telephone Encounter (Signed)
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    The manufacturing of Zostavax uses Neomycin in the process. Neomycin allergy is a contraindication to receiving the Zostavax vaccination.

## 2014-06-19 ENCOUNTER — Ambulatory Visit (INDEPENDENT_AMBULATORY_CARE_PROVIDER_SITE_OTHER): Payer: BC Managed Care – PPO | Admitting: *Deleted

## 2014-06-19 DIAGNOSIS — Z23 Encounter for immunization: Secondary | ICD-10-CM

## 2014-06-21 ENCOUNTER — Other Ambulatory Visit: Payer: Self-pay | Admitting: Family Medicine

## 2014-09-27 ENCOUNTER — Ambulatory Visit (INDEPENDENT_AMBULATORY_CARE_PROVIDER_SITE_OTHER): Payer: BC Managed Care – PPO | Admitting: Family Medicine

## 2014-09-27 ENCOUNTER — Encounter: Payer: Self-pay | Admitting: Family Medicine

## 2014-09-27 VITALS — BP 134/86 | HR 68 | Temp 98.3°F | Ht 66.0 in | Wt 122.0 lb

## 2014-09-27 DIAGNOSIS — I10 Essential (primary) hypertension: Secondary | ICD-10-CM

## 2014-09-27 DIAGNOSIS — E785 Hyperlipidemia, unspecified: Secondary | ICD-10-CM | POA: Diagnosis not present

## 2014-09-27 DIAGNOSIS — E1165 Type 2 diabetes mellitus with hyperglycemia: Secondary | ICD-10-CM

## 2014-09-27 DIAGNOSIS — IMO0002 Reserved for concepts with insufficient information to code with codable children: Secondary | ICD-10-CM

## 2014-09-27 LAB — POCT GLYCOSYLATED HEMOGLOBIN (HGB A1C): HEMOGLOBIN A1C: 5.9

## 2014-09-27 NOTE — Patient Instructions (Signed)
Great blood sugar control. Decrease your lipitor (atorvastatin) to half tablet (20 mg daily).  We will recheck your cholesterol on this lower dose on your next office visit.

## 2014-09-28 ENCOUNTER — Encounter: Payer: Self-pay | Admitting: Family Medicine

## 2014-09-28 NOTE — Progress Notes (Signed)
   Subjective:    Patient ID: Stefanie Wagner, female    DOB: November 09, 1950, 63 y.o.   MRN: [id number]  HPI HYPERTENSION  Disease Monitoring: Blood pressure range-not checking at home Chest pain- no      Dyspnea- no  Medications: Compliance- yes Lightheadedness- no   Edema- no   DIABETES  Disease Monitoring: Blood Sugar ranges-running in high 90s to low 100 in morning Polyuria- no New Visual problems- no  Medications: Compliance- diet Hypoglycemic symptoms- no    HYPERLIPIDEMIA  Disease Monitoring: See symptoms for Hypertension  Medications: Compliance- yes RUQ pain- no  Muscle aches- no    ROS See HPI above   PMH Smoking Status noted      Review of Systems     Objective:   Physical Exam VS reviewed Gen: NAD, groomed, upbeat mood Cor: RRR, No M/G/R, No JVD        Assessment & Plan:

## 2014-09-28 NOTE — Assessment & Plan Note (Signed)
Adequate blood pressure control.  No evidence of new end organ damage.  Tolerating medication without significant adverse effects.  Plan to continue current blood pressure regiment.   

## 2014-09-28 NOTE — Assessment & Plan Note (Signed)
Lab Results  Component Value Date   HGBA1C 5.9 09/27/2014   Adequate glycemic control.  Pt using diet and exercise. Continue current treatment plan.   Diabetes Prevention             Daily Aspirin: yes             Statin: yes             Dental evaluation in 71-month: yes             Recent eGFR: yes             ACEI: no  Eye Exam: yes  Foot Exam: uptodate  Diet pattern: excellent.

## 2014-09-28 NOTE — Assessment & Plan Note (Signed)
Lab Results  Component Value Date   LDLDIRECT 78 04/26/2014   Adequate lipid control. Tolerating medications.  No new end-organ damage.  Trial of decreasing Lipitor to 20 mg daily. Recheck LDL-direct next office visit in 4 months on lower dose.

## 2015-04-11 ENCOUNTER — Ambulatory Visit: Payer: Self-pay | Admitting: Family Medicine

## 2015-04-18 ENCOUNTER — Encounter: Payer: Self-pay | Admitting: Family Medicine

## 2015-04-18 ENCOUNTER — Ambulatory Visit (INDEPENDENT_AMBULATORY_CARE_PROVIDER_SITE_OTHER): Payer: BLUE CROSS/BLUE SHIELD | Admitting: Family Medicine

## 2015-04-18 VITALS — BP 145/84 | HR 72 | Temp 99.2°F | Ht 66.0 in | Wt 125.0 lb

## 2015-04-18 DIAGNOSIS — IMO0002 Reserved for concepts with insufficient information to code with codable children: Secondary | ICD-10-CM

## 2015-04-18 DIAGNOSIS — Z0000 Encounter for general adult medical examination without abnormal findings: Secondary | ICD-10-CM

## 2015-04-18 DIAGNOSIS — I10 Essential (primary) hypertension: Secondary | ICD-10-CM | POA: Diagnosis not present

## 2015-04-18 DIAGNOSIS — E7800 Pure hypercholesterolemia, unspecified: Secondary | ICD-10-CM | POA: Insufficient documentation

## 2015-04-18 DIAGNOSIS — M25511 Pain in right shoulder: Secondary | ICD-10-CM

## 2015-04-18 DIAGNOSIS — E1165 Type 2 diabetes mellitus with hyperglycemia: Secondary | ICD-10-CM

## 2015-04-18 DIAGNOSIS — Z114 Encounter for screening for human immunodeficiency virus [HIV]: Secondary | ICD-10-CM | POA: Diagnosis not present

## 2015-04-18 LAB — POCT GLYCOSYLATED HEMOGLOBIN (HGB A1C): HEMOGLOBIN A1C: 5.8

## 2015-04-18 LAB — HIV ANTIBODY (ROUTINE TESTING W REFLEX): HIV: NONREACTIVE

## 2015-04-18 LAB — LDL CHOLESTEROL, DIRECT: LDL DIRECT: 69 mg/dL

## 2015-04-18 NOTE — Patient Instructions (Signed)
Your blood sugar A1c is 5.8% which is lower than the 5.9% in December. Keep up your diet and exercise.  It is continuing to work for you.  Your blood pressure is under good control. Keep taking your medications. We are checking your cholesterol today. We are also screening your HIV status.  We are doing this on everyone over age 64 per CDC recommendations.   Dr Sherida Dobkins will call you if your tests are not good. Otherwise he will send you a letter.  If you sign up for MyChart online, you will be able to see your test results once Dr Myriam Brandhorst has reviewed them.  If you do not hear from [place] with in 2 weeks please call our office  Impingement Syndrome, Rotator Cuff, Bursitis with Rehab Impingement syndrome is a condition that involves inflammation of the tendons of the rotator cuff and the subacromial bursa, that causes pain in the shoulder. The rotator cuff consists of four tendons and muscles that control much of the shoulder and upper arm function. The subacromial bursa is a fluid filled sac that helps reduce friction between the rotator cuff and one of the bones of the shoulder (acromion). Impingement syndrome is usually an overuse injury that causes swelling of the bursa (bursitis), swelling of the tendon (tendonitis), and/or a tear of the tendon (strain). Strains are classified into three categories. Grade 1 strains cause pain, but the tendon is not lengthened. Grade 2 strains include a lengthened ligament, due to the ligament being stretched or partially ruptured. With grade 2 strains there is still function, although the function may be decreased. Grade 3 strains include a complete tear of the tendon or muscle, and function is usually impaired. SYMPTOMS   Pain around the shoulder, often at the outer portion of the upper arm.  Pain that gets worse with shoulder function, especially when reaching overhead or lifting.  Sometimes, aching when not using the arm.  Pain that wakes you up at  night.  Sometimes, tenderness, swelling, warmth, or redness over the affected area.  Loss of strength.  Limited motion of the shoulder, especially reaching behind the back (to the back pocket or to unhook bra) or across your body.  Crackling sound (crepitation) when moving the arm.  Biceps tendon pain and inflammation (in the front of the shoulder). Worse when bending the elbow or lifting. CAUSES  Impingement syndrome is often an overuse injury, in which chronic (repetitive) motions cause the tendons or bursa to become inflamed. A strain occurs when a force is paced on the tendon or muscle that is greater than it can withstand. Common mechanisms of injury include: Stress from sudden increase in duration, frequency, or intensity of training.  Direct hit (trauma) to the shoulder.  Aging, erosion of the tendon with normal use.  Bony bump on shoulder (acromial spur). RISK INCREASES WITH:  Contact sports (football, wrestling, boxing).  Throwing sports (baseball, tennis, volleyball).  Weightlifting and bodybuilding.  Heavy labor.  Previous injury to the rotator cuff, including impingement.  Poor shoulder strength and flexibility.  Failure to warm up properly before activity.  Inadequate protective equipment.  Old age.  Bony bump on shoulder (acromial spur). PREVENTION   Warm up and stretch properly before activity.  Allow for adequate recovery between workouts.  Maintain physical fitness:  Strength, flexibility, and endurance.  Cardiovascular fitness.  Learn and use proper exercise technique. PROGNOSIS  If treated properly, impingement syndrome usually goes away within 6 weeks. Sometimes surgery is required.  RELATED COMPLICATIONS  Longer healing time if not properly treated, or if not given enough time to heal.  Recurring symptoms, that result in a chronic condition.  Shoulder stiffness, frozen shoulder, or loss of motion.  Rotator cuff tendon  tear.  Recurring symptoms, especially if activity is resumed too soon, with overuse, with a direct blow, or when using poor technique. TREATMENT  Treatment first involves the use of ice and medicine, to reduce pain and inflammation. The use of strengthening and stretching exercises may help reduce pain with activity. These exercises may be performed at home or with a therapist. If non-surgical treatment is unsuccessful after more than 6 months, surgery may be advised. After surgery and rehabilitation, activity is usually possible in 3 months.  MEDICATION  If pain medicine is needed, nonsteroidal anti-inflammatory medicines (aspirin and ibuprofen), or other minor pain relievers (acetaminophen), are often advised.  Do not take pain medicine for 7 days before surgery.  Prescription pain relievers may be given, if your caregiver thinks they are needed. Use only as directed and only as much as you need.  Corticosteroid injections may be given by your caregiver. These injections should be reserved for the most serious cases, because they may only be given a certain number of times. HEAT AND COLD  Cold treatment (icing) should be applied for 10 to 15 minutes every 2 to 3 hours for inflammation and pain, and immediately after activity that aggravates your symptoms. Use ice packs or an ice massage.  Heat treatment may be used before performing stretching and strengthening activities prescribed by your caregiver, physical therapist, or athletic trainer. Use a heat pack or a warm water soak. SEEK MEDICAL CARE IF:   Symptoms get worse or do not improve in 4 to 6 weeks, despite treatment.  New, unexplained symptoms develop. (Drugs used in treatment may produce side effects.) EXERCISES  RANGE OF MOTION (ROM) AND STRETCHING EXERCISES - Impingement Syndrome (Rotator Cuff  Tendinitis, Bursitis) These exercises may help you when beginning to rehabilitate your injury. Your symptoms may go away with or  without further involvement from your physician, physical therapist or athletic trainer. While completing these exercises, remember:   Restoring tissue flexibility helps normal motion to return to the joints. This allows healthier, less painful movement and activity.  An effective stretch should be held for at least 30 seconds.  A stretch should never be painful. You should only feel a gentle lengthening or release in the stretched tissue. STRETCH - Flexion, Standing  Stand with good posture. With an underhand grip on your right / left hand, and an overhand grip on the opposite hand, grasp a broomstick or cane so that your hands are a little more than shoulder width apart.  Keeping your right / left elbow straight and shoulder muscles relaxed, push the stick with your opposite hand, to raise your right / left arm in front of your body and then overhead. Raise your arm until you feel a stretch in your right / left shoulder, but before you have increased shoulder pain.  Try to avoid shrugging your right / left shoulder as your arm rises, by keeping your shoulder blade tucked down and toward your mid-back spine. Hold for __________ seconds.  Slowly return to the starting position. Repeat ________10__ times. Complete this exercise _____3_____ times per day. STRETCH - Abduction, Supine  Lie on your back. With an underhand grip on your right / left hand and an overhand grip on the opposite hand, grasp a broomstick or cane so  that your hands are a little more than shoulder width apart.  Keeping your right / left elbow straight and your shoulder muscles relaxed, push the stick with your opposite hand, to raise your right / left arm out to the side of your body and then overhead. Raise your arm until you feel a stretch in your right / left shoulder, but before you have increased shoulder pain.  Try to avoid shrugging your right / left shoulder as your arm rises, by keeping your shoulder blade tucked  down and toward your mid-back spine. Hold for __________ seconds.  Slowly return to the starting position. Repeat ____10______ times. Complete this exercise _____3_____ times per day. ROM - Flexion, Active-Assisted  Lie on your back. You may bend your knees for comfort.  Grasp a broomstick or cane so your hands are about shoulder width apart. Your right / left hand should grip the end of the stick, so that your hand is positioned "thumbs-up," as if you were about to shake hands.  Using your healthy arm to lead, raise your right / left arm overhead, until you feel a gentle stretch in your shoulder. Hold for __________ seconds.  Use the stick to assist in returning your right / left arm to its starting position. Repeat ___10_______ times. Complete this exercise ____3______ times per day.  ROM - Internal Rotation, Supine   Lie on your back on a firm surface. Place your right / left elbow about 60 degrees away from your side. Elevate your elbow with a folded towel, so that the elbow and shoulder are the same height.  Using a broomstick or cane and your strong arm, pull your right / left hand toward your body until you feel a gentle stretch, but no increase in your shoulder pain. Keep your shoulder and elbow in place throughout the exercise.  Hold for ___10_______ seconds. Slowly return to the starting position. Repeat ____3______ times. Complete this exercise ____3______ times per day. STRETCH - Internal Rotation  Place your right / left hand behind your back, palm up.  Throw a towel or belt over your opposite shoulder. Grasp the towel with your right / left hand.  While keeping an upright posture, gently pull up on the towel, until you feel a stretch in the front of your right / left shoulder.  Avoid shrugging your right / left shoulder as your arm rises, by keeping your shoulder blade tucked down and toward your mid-back spine.  Hold for ___10_______ seconds. Release the stretch, by  lowering your healthy hand. Repeat _____3_____ times. Complete this exercise _____3_____ times per day. ROM - Internal Rotation   Using an underhand grip, grasp a stick behind your back with both hands.  While standing upright with good posture, slide the stick up your back until you feel a mild stretch in the front of your shoulder.  Hold for _____10_____ seconds. Slowly return to your starting position. Repeat ___3_______ times. Complete this exercise ____3______ times per day.  STRETCH - Posterior Shoulder Capsule   Stand or sit with good posture. Grasp your right / left elbow and draw it across your chest, keeping it at the same height as your shoulder.  Pull your elbow, so your upper arm comes in closer to your chest. Pull until you feel a gentle stretch in the back of your shoulder.  Hold for __10________ seconds. Repeat ____3______ times. Complete this exercise ____3______ times per day. STRENGTHENING EXERCISES - Impingement Syndrome (Rotator Cuff Tendinitis, Bursitis) These exercises may  help you when beginning to rehabilitate your injury. They may resolve your symptoms with or without further involvement from your physician, physical therapist or athletic trainer. While completing these exercises, remember:  Muscles can gain both the endurance and the strength needed for everyday activities through controlled exercises.  Complete these exercises as instructed by your physician, physical therapist or athletic trainer. Increase the resistance and repetitions only as guided.  You may experience muscle soreness or fatigue, but the pain or discomfort you are trying to eliminate should never worsen during these exercises. If this pain does get worse, stop and make sure you are following the directions exactly. If the pain is still present after adjustments, discontinue the exercise until you can discuss the trouble with your clinician.  During your recovery, avoid activity or exercises  which involve actions that place your injured hand or elbow above your head or behind your back or head. These positions stress the tissues which you are trying to heal. STRENGTH - Scapular Depression and Adduction   With good posture, sit on a firm chair. Support your arms in front of you, with pillows, arm rests, or on a table top. Have your elbows in line with the sides of your body.  Gently draw your shoulder blades down and toward your mid-back spine. Gradually increase the tension, without tensing the muscles along the top of your shoulders and the back of your neck.  Hold for ____10______ seconds. Slowly release the tension and relax your muscles completely before starting the next repetition.  After you have practiced this exercise, remove the arm support and complete the exercise in standing as well as sitting position. Repeat ____3______ times. Complete this exercise ____3______ times per day.  STRENGTH - Shoulder Abductors, Isometric  With good posture, stand or sit about 4-6 inches from a wall, with your right / left side facing the wall.  Bend your right / left elbow. Gently press your right / left elbow into the wall. Increase the pressure gradually, until you are pressing as hard as you can, without shrugging your shoulder or increasing any shoulder discomfort.  Hold for ___10_______ seconds.  Release the tension slowly. Relax your shoulder muscles completely before you begin the next repetition. Repeat ____3______ times. Complete this exercise ____3______ times per day.  STRENGTH - External Rotators, Isometric  Keep your right / left elbow at your side and bend it 90 degrees.  Step into a door frame so that the outside of your right / left wrist can press against the door frame without your upper arm leaving your side.  Gently press your right / left wrist into the door frame, as if you were trying to swing the back of your hand away from your stomach. Gradually increase  the tension, until you are pressing as hard as you can, without shrugging your shoulder or increasing any shoulder discomfort.  Hold for ____10______ seconds.  Release the tension slowly. Relax your shoulder muscles completely before you begin the next repetition. Repeat ____3______ times. Complete this exercise ____3______ times per day.  STRENGTH - Supraspinatus   Stand or sit with good posture. Grasp a ____2______ weight, or an exercise band or tubing, so that your hand is "thumbs-up," like you are shaking hands.  Slowly lift your right / left arm in a "V" away from your thigh, diagonally into the space between your side and straight ahead. Lift your hand to shoulder height or as far as you can, without increasing any shoulder pain.  At first, many people do not lift their hands above shoulder height.  Avoid shrugging your right / left shoulder as your arm rises, by keeping your shoulder blade tucked down and toward your mid-back spine.  Hold for ___10_______ seconds. Control the descent of your hand, as you slowly return to your starting position. Repeat ___10_______ times. Complete this exercise ____3______ times per day.  STRENGTH - External Rotators  Secure a rubber exercise band or tubing to a fixed object (table, pole) so that it is at the same height as your right / left elbow when you are standing or sitting on a firm surface.  Stand or sit so that the secured exercise band is at your uninjured side.  Bend your right / left elbow 90 degrees. Place a folded towel or small pillow under your right / left arm, so that your elbow is a few inches away from your side.  Keeping the tension on the exercise band, pull it away from your body, as if pivoting on your elbow. Be sure to keep your body steady, so that the movement is coming only from your rotating shoulder.  Hold for ___10_______ seconds. Release the tension in a controlled manner, as you return to the starting position. Repeat  _____3_____ times. Complete this exercise ____10______ times per day.  STRENGTH - Internal Rotators   Secure a rubber exercise band or tubing to a fixed object (table, pole) so that it is at the same height as your right / left elbow when you are standing or sitting on a firm surface.  Stand or sit so that the secured exercise band is at your right / left side.  Bend your elbow 90 degrees. Place a folded towel or small pillow under your right / left arm so that your elbow is a few inches away from your side.  Keeping the tension on the exercise band, pull it across your body, toward your stomach. Be sure to keep your body steady, so that the movement is coming only from your rotating shoulder.  Hold for ___10_______ seconds. Release the tension in a controlled manner, as you return to the starting position. Repeat _____10_____ times. Complete this exercise ______3____ times per day.  STRENGTH - Scapular Protractors, Standing   Stand arms length away from a wall. Place your hands on the wall, keeping your elbows straight.  Begin by dropping your shoulder blades down and toward your mid-back spine.  To strengthen your protractors, keep your shoulder blades down, but slide them forward on your rib cage. It will feel as if you are lifting the back of your rib cage away from the wall. This is a subtle motion and can be challenging to complete. Ask your caregiver for further instruction, if you are not sure you are doing the exercise correctly.  Hold for ____10______ seconds. Slowly return to the starting position, resting the muscles completely before starting the next repetition. Repeat _____10_____ times. Complete this exercise ____3______ times per day. STRENGTH - Scapular Protractors, Supine  Lie on your back on a firm surface. Extend your right / left arm straight into the air while holding a __________ weight in your hand.  Keeping your head and back in place, lift your shoulder off the  floor.  Hold for ___10_______ seconds. Slowly return to the starting position, and allow your muscles to relax completely before starting the next repetition. Repeat ____10______ times. Complete this exercise _____3_____ times per day. STRENGTH - Scapular Protractors, Quadruped  Get onto  your hands and knees, with your shoulders directly over your hands (or as close as you can be, comfortably).  Keeping your elbows locked, lift the back of your rib cage up into your shoulder blades, so your mid-back rounds out. Keep your neck muscles relaxed.  Hold this position for ___10_______ seconds. Slowly return to the starting position and allow your muscles to relax completely before starting the next repetition. Repeat _____10_____ times. Complete this exercise ____3______ times per day.  STRENGTH - Scapular Retractors  Secure a rubber exercise band or tubing to a fixed object (table, pole), so that it is at the height of your shoulders when you are either standing, or sitting on a firm armless chair.  With a palm down grip, grasp an end of the band in each hand. Straighten your elbows and lift your hands straight in front of you, at shoulder height. Step back, away from the secured end of the band, until it becomes tense.  Squeezing your shoulder blades together, draw your elbows back toward your sides, as you bend them. Keep your upper arms lifted away from your body throughout the exercise.  Hold for ____10______ seconds. Slowly ease the tension on the band, as you reverse the directions and return to the starting position. Repeat ___10_______ times. Complete this exercise ______3____ times per day. STRENGTH - Shoulder Extensors   Secure a rubber exercise band or tubing to a fixed object (table, pole) so that it is at the height of your shoulders when you are either standing, or sitting on a firm armless chair.  With a thumbs-up grip, grasp an end of the band in each hand. Straighten your elbows  and lift your hands straight in front of you, at shoulder height. Step back, away from the secured end of the band, until it becomes tense.  Squeezing your shoulder blades together, pull your hands down to the sides of your thighs. Do not allow your hands to go behind you.  Hold for ___10_______ seconds. Slowly ease the tension on the band, as you reverse the directions and return to the starting position. Repeat ___10_______ times. Complete this exercise _____3_____ times per day.  STRENGTH - Scapular Retractors and External Rotators   Secure a rubber exercise band or tubing to a fixed object (table, pole) so that it is at the height as your shoulders, when you are either standing, or sitting on a firm armless chair.  With a palm down grip, grasp an end of the band in each hand. Bend your elbows 90 degrees and lift your elbows to shoulder height, at your sides. Step back, away from the secured end of the band, until it becomes tense.  Squeezing your shoulder blades together, rotate your shoulders so that your upper arms and elbows remain stationary, but your fists travel upward to head height.  Hold for ____10______ seconds. Slowly ease the tension on the band, as you reverse the directions and return to the starting position. Repeat ____10______ times. Complete this exercise _____3_____ times per day.  STRENGTH - Scapular Retractors and External Rotators, Rowing   Secure a rubber exercise band or tubing to a fixed object (table, pole) so that it is at the height of your shoulders, when you are either standing, or sitting on a firm armless chair.  With a palm down grip, grasp an end of the band in each hand. Straighten your elbows and lift your hands straight in front of you, at shoulder height. Step back, away from the  secured end of the band, until it becomes tense.  Step 1: Squeeze your shoulder blades together. Bending your elbows, draw your hands to your chest, as if you are rowing a  boat. At the end of this motion, your hands and elbow should be at shoulder height and your elbows should be out to your sides.  Step 2: Rotate your shoulders, to raise your hands above your head. Your forearms should be vertical and your upper arms should be horizontal.  Hold for _____10_____ seconds. Slowly ease the tension on the band, as you reverse the directions and return to the starting position. Repeat __10________ times. Complete this exercise _____3_____ times per day.   Document Released: 09/28/2005 Document Revised: 12/21/2011 Document Reviewed: 01/10/2009 [facility] Patient Information 2015 [place], [place]. This information is not intended to replace advice given to you by your health care provider. Make sure you discuss any questions you have with your health care provider.

## 2015-04-22 ENCOUNTER — Encounter: Payer: Self-pay | Admitting: Family Medicine

## 2015-04-22 DIAGNOSIS — M25519 Pain in unspecified shoulder: Secondary | ICD-10-CM | POA: Insufficient documentation

## 2015-04-22 NOTE — Assessment & Plan Note (Addendum)
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

## 2015-04-22 NOTE — Progress Notes (Signed)
   Subjective:    Patient ID: Stefanie Wagner, female    DOB: [birth year], 64 y.o.   MRN: [id number]  HPI  Problem List Items Addressed This Visit      Other    HYPERTENSION  Disease Monitoring: Blood pressure range-not checking at home Chest pain- no      Dyspnea- no  Medications: Compliance- yes Lightheadedness- no   Edema- no   DIABETES  Disease Monitoring: Blood Sugar ranges-not checking at home Polyuria- no New Visual problems- no, tx for glaucoma  Medications: Compliance- diet and exercise, no medications Hypoglycemic symptoms- no    HYPERLIPIDEMIA  Disease Monitoring: See symptoms for Hypertension  Medications: Compliance- yes, Atorvastatin 20 mg daily RUQ pain- no  Muscle aches- no    ROS See HPI above   PMH Smoking Status noted       Pain in joint, shoulder region Onset: Abt 3 weeks ago Location: anterior and lateral right shoulder Quality: aching Severity: mild to moderate Function: not interfereing with sleeep Duration: 3 weeks Pattern: intermitent Course: stable Radiation: no Relief: tried nothing Precipitant: none recalled.  No history of similar symptoms Associated Symptoms: no neck pain, no arm nor hand weakness. 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 Feet without deformity or lesions.  Palpable bilateral pedal pulses.  MSH: Right shoulder: No deformity, nontender TTP right clavicle, AC joint and subacromial area,  (+) impingement sign, (-) Neer sign, Drop Test negative  DTR: Bilateral Bicep 2+, Bilateral Triceps 1+ Gait: Normal speed, No significant path deviation, Step through +,  Psych: Normal affect/thought/speech/language        Assessment & Plan:  See Problem list

## 2015-04-22 NOTE — Assessment & Plan Note (Signed)
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 OTC NSAIDs prn Shoulder eexercises prescribed. RTC if not improving over next month or if condition worsens or significantly interfering with daily functioning.

## 2015-04-24 ENCOUNTER — Telehealth: Payer: Self-pay | Admitting: Family Medicine

## 2015-04-24 NOTE — Telephone Encounter (Signed)
Pt called because her cholesterol is at 69 the lowest it has bee. She would like to know can she stop taking her cholesterol medication? She also was wondering can she stop one of the BP medications also? Please call her on her cell to discuss. jw

## 2015-04-24 NOTE — Telephone Encounter (Signed)
Pt would like to stop taking atorvastatin altogether. As of dec dr told her she couold a half a pill. Her LDL went from 76 to 69.  Is really watching what she eats  Please advise

## 2015-04-24 NOTE — Telephone Encounter (Signed)
My explanation for her LDL cholesterol being at goal is from her Atorvastatin.  It is very unlikely that it is just from her dietary and exercise changes.  I would recommend she continue on the Atorvastatin 20 mg daily unless she develops an intolerance to the medication.

## 2015-04-24 NOTE — Telephone Encounter (Signed)
Will forward to MD.  Per her recent visit patient is to continue on her BP medications. Stefanie Wagner,CMA

## 2015-04-25 NOTE — Telephone Encounter (Signed)
LM for patient to call back. Jazmin Hartsell,CMA  

## 2015-04-25 NOTE — Telephone Encounter (Signed)
American College of Cardiology Cholesterol guidelines recommend that she take a statin to prevent heart diseas and stroke.    Mrs Stefanie Wagner may stop the medication if she wants, but her LDL cholesterol will very likely return to near  150 to 170 mg/dL, which is far above the goal of less than 100 mg/dL.

## 2015-04-25 NOTE — Telephone Encounter (Signed)
Will check with MD.  Patient may need an appt to discuss this option. Amayia Ciano,CMA

## 2015-04-26 ENCOUNTER — Telehealth: Payer: Self-pay | Admitting: Family Medicine

## 2015-04-26 MED ORDER — ATORVASTATIN CALCIUM 20 MG PO TABS: 20.0000 mg | ORAL_TABLET | Freq: Every day | ORAL | Status: DC

## 2015-04-26 NOTE — Telephone Encounter (Signed)
Patient is returning call.  °

## 2015-04-26 NOTE — Telephone Encounter (Signed)
Pt called back and is going to take Dr. McDiarmid's advice and stay on the Atorvastatin. She would like him to call in the 20 mg tablets so that she doesn't have to cut them or a better idea would to be on 10 mg. [facility] a new prescription to the pharmacy. jw

## 2015-04-26 NOTE — Telephone Encounter (Signed)
Will forward to MD. Nezar Buckles,CMA  

## 2015-04-26 NOTE — Telephone Encounter (Signed)
Spoke with patient and she is trying really hard to watch her diet and exercise.  She plans to come off her medication about 3 months before her next appt so we can recheck at her next visit to see if her body has been able to control cholesterol on it's own.  She will go back on medication if needed.  Montasia Chisenhall,CMA

## 2015-05-15 ENCOUNTER — Other Ambulatory Visit: Payer: Self-pay

## 2015-05-15 DIAGNOSIS — Z1231 Encounter for screening mammogram for malignant neoplasm of breast: Secondary | ICD-10-CM

## 2015-05-25 ENCOUNTER — Other Ambulatory Visit: Payer: Self-pay | Admitting: Family Medicine

## 2015-06-03 ENCOUNTER — Ambulatory Visit
Admission: RE | Admit: 2015-06-03 | Discharge: 2015-06-03 | Disposition: A | Discharge status: Home / Self Care | Payer: BLUE CROSS/BLUE SHIELD | Source: Ambulatory Visit

## 2015-06-03 DIAGNOSIS — Z1231 Encounter for screening mammogram for malignant neoplasm of breast: Secondary | ICD-10-CM

## 2015-07-24 ENCOUNTER — Ambulatory Visit (INDEPENDENT_AMBULATORY_CARE_PROVIDER_SITE_OTHER): Payer: BLUE CROSS/BLUE SHIELD | Admitting: *Deleted

## 2015-07-24 DIAGNOSIS — Z23 Encounter for immunization: Secondary | ICD-10-CM | POA: Diagnosis not present

## 2015-09-12 ENCOUNTER — Encounter: Payer: Self-pay | Admitting: Family Medicine

## 2015-09-12 ENCOUNTER — Ambulatory Visit (INDEPENDENT_AMBULATORY_CARE_PROVIDER_SITE_OTHER): Payer: BLUE CROSS/BLUE SHIELD | Admitting: Family Medicine

## 2015-09-12 VITALS — BP 137/75 | HR 71 | Temp 98.0°F | Ht 66.0 in | Wt 124.1 lb

## 2015-09-12 DIAGNOSIS — I10 Essential (primary) hypertension: Secondary | ICD-10-CM

## 2015-09-12 DIAGNOSIS — E7800 Pure hypercholesterolemia, unspecified: Secondary | ICD-10-CM | POA: Diagnosis not present

## 2015-09-12 DIAGNOSIS — E1165 Type 2 diabetes mellitus with hyperglycemia: Secondary | ICD-10-CM

## 2015-09-12 DIAGNOSIS — E118 Type 2 diabetes mellitus with unspecified complications: Secondary | ICD-10-CM | POA: Diagnosis not present

## 2015-09-12 LAB — POCT GLYCOSYLATED HEMOGLOBIN (HGB A1C): Hemoglobin A1C: 5.7

## 2015-09-12 NOTE — Assessment & Plan Note (Signed)
Adequate blood pressure control.  No evidence of new end organ damage.  Tolerating medication without significant adverse effects.  Plan to continue current blood pressure regiment.   

## 2015-09-12 NOTE — Assessment & Plan Note (Signed)
Adequate glycemic control.  Controlled accheived and maintained by lifestyle changes.  Continue current treatment plan.

## 2015-09-12 NOTE — Progress Notes (Signed)
   Subjective:    Patient ID: Stefanie Wagner, female    DOB: Dec 16, 1950, 64 y.o.   MRN: [id number] Stefanie Wagner is accompanied by spouse Sources of clinical information for visit is/are patient, spouse/SO and past medical records.  HPI HYPERTENSION  Disease Monitoring: Blood pressure range-not checking at home Chest pain- no      Dyspnea- no  Medications: Compliance- yes Lightheadedness- no   Edema- no   DIABETES  Disease Monitoring: Blood Sugar ranges-not checking at home  Polyuria- no New Visual problems- no  Medications: Compliance- Lifestyle treatment only Hypoglycemic symptoms- no    HYPERLIPIDEMIA  Disease Monitoring: See symptoms for Hypertension  Medications: Compliance- yes RUQ pain- no  Muscle aches- no    ROS See HPI above   PMH Smoking Status noted       Review of Systems     Objective:   Physical Exam VS noted General: Alert, cooperative, groomed.  Cor: RRR, no Murmur, no JVD Lungs: BCTA, no acc mm use Diabetic Foot Exam - Simple   Simple Foot Form  Diabetic Foot exam was performed with the following findings:  Yes 09/12/2015  4:51 PM  Visual Inspection  No deformities, no ulcerations, no other skin breakdown bilaterally:  Yes  Sensation Testing  Intact to touch and monofilament testing bilaterally:  Yes  Pulse Check  Posterior Tibialis and Dorsalis pulse intact bilaterally:  Yes  Comments     Psych: Normal affect/speech/language Neuro: normal gait      Assessment & Plan:

## 2015-09-12 NOTE — Assessment & Plan Note (Signed)
Established problem Adequate lipid control Tolerating medications.  No new end-organ damage.  Continue current medications.Lipitor

## 2016-05-08 ENCOUNTER — Other Ambulatory Visit: Payer: Self-pay | Admitting: Family Medicine

## 2016-05-08 DIAGNOSIS — Z1231 Encounter for screening mammogram for malignant neoplasm of breast: Secondary | ICD-10-CM

## 2016-05-11 ENCOUNTER — Other Ambulatory Visit: Payer: Self-pay | Admitting: Family Medicine

## 2016-06-05 ENCOUNTER — Ambulatory Visit
Admission: RE | Admit: 2016-06-05 | Discharge: 2016-06-05 | Disposition: A | Discharge status: Home / Self Care | Payer: BLUE CROSS/BLUE SHIELD | Source: Ambulatory Visit | Attending: Family Medicine | Admitting: Family Medicine

## 2016-06-05 DIAGNOSIS — Z1231 Encounter for screening mammogram for malignant neoplasm of breast: Secondary | ICD-10-CM

## 2016-06-10 ENCOUNTER — Telehealth: Payer: Self-pay | Admitting: Family Medicine

## 2016-06-10 NOTE — Telephone Encounter (Signed)
Pt was called for jury duty 07-14-16. She needs a letter from dr stating she is sole caregiver for Automatic DataMelissa Wagner 07[birth year]79.  Please fax to pt at [phone]

## 2016-06-11 ENCOUNTER — Encounter: Payer: Self-pay | Admitting: Family Medicine

## 2016-06-11 NOTE — Telephone Encounter (Signed)
Please let patient know her Payton MccallumJury Duty letter is available for pick up from the [facility] front desk.

## 2016-06-11 NOTE — Telephone Encounter (Signed)
LM for pt to return my call, if she calls back please just let her know her jury duty letter is ready for pick up, thanks!

## 2016-06-11 NOTE — Progress Notes (Signed)
Jury Duty excuse written for Ms Stefanie DowningDonna Lasure

## 2016-07-08 ENCOUNTER — Ambulatory Visit (INDEPENDENT_AMBULATORY_CARE_PROVIDER_SITE_OTHER): Payer: BLUE CROSS/BLUE SHIELD | Admitting: *Deleted

## 2016-07-08 DIAGNOSIS — Z23 Encounter for immunization: Secondary | ICD-10-CM

## 2016-08-31 DIAGNOSIS — H401122 Primary open-angle glaucoma, left eye, moderate stage: Secondary | ICD-10-CM | POA: Diagnosis not present

## 2016-08-31 DIAGNOSIS — H401111 Primary open-angle glaucoma, right eye, mild stage: Secondary | ICD-10-CM | POA: Diagnosis not present

## 2016-09-10 ENCOUNTER — Encounter: Payer: Self-pay | Admitting: Family Medicine

## 2016-09-10 ENCOUNTER — Ambulatory Visit (INDEPENDENT_AMBULATORY_CARE_PROVIDER_SITE_OTHER): Payer: Medicare Other | Admitting: Family Medicine

## 2016-09-10 VITALS — BP 134/88 | HR 81 | Temp 97.7°F | Ht 66.0 in | Wt 126.0 lb

## 2016-09-10 DIAGNOSIS — E785 Hyperlipidemia, unspecified: Secondary | ICD-10-CM

## 2016-09-10 DIAGNOSIS — Z0000 Encounter for general adult medical examination without abnormal findings: Secondary | ICD-10-CM

## 2016-09-10 DIAGNOSIS — Z23 Encounter for immunization: Secondary | ICD-10-CM | POA: Diagnosis not present

## 2016-09-10 DIAGNOSIS — I10 Essential (primary) hypertension: Secondary | ICD-10-CM

## 2016-09-10 DIAGNOSIS — E119 Type 2 diabetes mellitus without complications: Secondary | ICD-10-CM

## 2016-09-10 LAB — LIPID PANEL
Cholesterol: 160 mg/dL (ref <200)
HDL: 64 mg/dL (ref >50)
LDL CALC: 69 mg/dL (ref <100)
TRIGLYCERIDES: 137 mg/dL (ref <150)
Total CHOL/HDL Ratio: 2.5 Ratio (ref <5.0)
VLDL: 27 mg/dL (ref <30)

## 2016-09-10 LAB — POCT GLYCOSYLATED HEMOGLOBIN (HGB A1C): Hemoglobin A1C: 5.8

## 2016-09-10 NOTE — Patient Instructions (Signed)
Your blood pressure and blood sugar is under good control.  I recommend you continue taking your indapamide and amlodipine.   We are checking your cholesterol today.  Dr Zohaib Heeney will call you if your tests are not good. Otherwise he will send you a letter.  If you sign up for MyChart online, you will be able to see your test results once Dr Jumar Greenstreet has reviewed them.  If you do not hear from us with in 2 weeks please call our office   Consider scheduling a Welcome to Medicare visit with our nurse, Alecia LemmingLauren Ducatte.  Medicare pays 100% for this Health Maintenance visit.   Health Maintenance recommendations: Bone Density testing for osteoporosis Booster pneumonia vaccination in one year Colon cancer screening with colonoscopy, blood stool cards, or immunochemical stool cards.

## 2016-09-11 ENCOUNTER — Encounter: Payer: Self-pay | Admitting: Family Medicine

## 2016-09-11 NOTE — Assessment & Plan Note (Signed)
Ms Shirlee LimerickMarion declined CRC screening including endoscopic and screening FOBT  Neomycin allergy is contraindication to admininstration of Zostavax Recommended that patient schedule herself for a "Welcome to Medicare" visit.  She said she would think about it.  Pt may be able to recive the recombinant zosters vaccination as it may not contain Neomycin like the Live attenuated zoster vaccine.

## 2016-09-11 NOTE — Assessment & Plan Note (Addendum)
Lab Results  Component Value Date   HGBA1C 5.8 09/10/2016  Adequate glycemic control.  Continue current diet and exercise.

## 2016-09-11 NOTE — Assessment & Plan Note (Signed)
Adequate blood pressure control.  No evidence of new end organ damage.  Tolerating medication without significant adverse effects.  Plan to continue current blood pressure regiment.   

## 2016-09-11 NOTE — Assessment & Plan Note (Signed)
Established problem. Stable. Continue current atorvastatin 20 mg daily. Checking lipid panel today

## 2016-09-11 NOTE — Progress Notes (Signed)
   Subjective:    Patient ID: Stefanie Wagner, female    DOB: 1951-04-19, 65 y.o.   MRN: [id number] Stefanie Wagner is alone Sources of clinical information for visit is/are patient and past medical records. Nursing assessment for this office visit was reviewed with the patient for accuracy and revision.   HPI HYPERTENSION  Disease Monitoring: Blood pressure range-not checking at home Chest pain- no      Dyspnea- no  Medications: Compliance- yes Lightheadedness- no   Edema- no   DIABETES  Disease Monitoring: Blood Sugar ranges-not checking at home  Polyuria- no New Visual problems- no  Medications: Compliance- Lifestyle treatment only Hypoglycemic symptoms- no    HYPERLIPIDEMIA  Disease Monitoring: See symptoms for Hypertension  Medications: Compliance- yes RUQ pain- no  Muscle aches- no    ROS See HPI above   PMH Smoking Status noted       Review of Systems     Objective:   Physical Exam VS noted General: Alert, cooperative, groomed.  Cor: RRR, no Murmur, no JVD Lungs: BCTA, no acc mm use Diabetic Foot Exam - Simple   Simple Foot Form Diabetic Foot exam was performed with the following findings:  Yes 09/10/2016 11:13 AM  Visual Inspection No deformities, no ulcerations, no other skin breakdown bilaterally:  Yes Sensation Testing Intact to touch and monofilament testing bilaterally:  Yes Pulse Check Posterior Tibialis and Dorsalis pulse intact bilaterally:  Yes Comments    Psych: Normal affect/speech/language Neuro: normal gait      Assessment & Plan:

## 2016-11-02 ENCOUNTER — Other Ambulatory Visit: Payer: Self-pay | Admitting: Family Medicine

## 2016-11-03 ENCOUNTER — Other Ambulatory Visit: Payer: Self-pay | Admitting: *Deleted

## 2016-11-04 MED ORDER — INDAPAMIDE 1.25 MG PO TABS: 1.2500 mg | ORAL_TABLET | Freq: Every morning | ORAL | 3 refills | Status: DC

## 2016-12-01 DIAGNOSIS — H401131 Primary open-angle glaucoma, bilateral, mild stage: Secondary | ICD-10-CM | POA: Diagnosis not present

## 2017-04-28 ENCOUNTER — Other Ambulatory Visit: Payer: Self-pay | Admitting: Family Medicine

## 2017-05-13 ENCOUNTER — Other Ambulatory Visit: Payer: Self-pay | Admitting: Family Medicine

## 2017-05-13 DIAGNOSIS — Z1231 Encounter for screening mammogram for malignant neoplasm of breast: Secondary | ICD-10-CM

## 2017-05-31 DIAGNOSIS — H5203 Hypermetropia, bilateral: Secondary | ICD-10-CM | POA: Diagnosis not present

## 2017-05-31 DIAGNOSIS — H401131 Primary open-angle glaucoma, bilateral, mild stage: Secondary | ICD-10-CM | POA: Diagnosis not present

## 2017-05-31 LAB — HM DIABETES EYE EXAM

## 2017-06-07 ENCOUNTER — Ambulatory Visit
Admission: RE | Admit: 2017-06-07 | Discharge: 2017-06-07 | Disposition: A | Discharge status: Home / Self Care | Payer: Medicare Other | Source: Ambulatory Visit | Attending: Family Medicine | Admitting: Family Medicine

## 2017-06-07 DIAGNOSIS — Z1231 Encounter for screening mammogram for malignant neoplasm of breast: Secondary | ICD-10-CM | POA: Diagnosis not present

## 2017-06-10 ENCOUNTER — Ambulatory Visit (INDEPENDENT_AMBULATORY_CARE_PROVIDER_SITE_OTHER): Payer: Medicare Other | Admitting: *Deleted

## 2017-06-10 DIAGNOSIS — Z23 Encounter for immunization: Secondary | ICD-10-CM | POA: Diagnosis not present

## 2017-07-20 LAB — HM DIABETES EYE EXAM

## 2017-08-12 ENCOUNTER — Ambulatory Visit (INDEPENDENT_AMBULATORY_CARE_PROVIDER_SITE_OTHER): Payer: Medicare Other | Admitting: Family Medicine

## 2017-08-12 ENCOUNTER — Encounter: Payer: Self-pay | Admitting: Family Medicine

## 2017-08-12 VITALS — BP 122/70 | HR 72 | Temp 98.1°F | Wt 128.0 lb

## 2017-08-12 DIAGNOSIS — I10 Essential (primary) hypertension: Secondary | ICD-10-CM | POA: Diagnosis not present

## 2017-08-12 DIAGNOSIS — E119 Type 2 diabetes mellitus without complications: Secondary | ICD-10-CM | POA: Diagnosis not present

## 2017-08-12 DIAGNOSIS — E785 Hyperlipidemia, unspecified: Secondary | ICD-10-CM

## 2017-08-12 LAB — POCT GLYCOSYLATED HEMOGLOBIN (HGB A1C): HEMOGLOBIN A1C: 6

## 2017-08-12 NOTE — Patient Instructions (Signed)
Your blood sugar control is outstanding. Your blood pressure control is at at goal. Keep it up :)

## 2017-08-13 NOTE — Assessment & Plan Note (Signed)
Lab Results  Component Value Date   HGBA1C 6.0 08/12/2017  Established problem Controlled Continue lifestyle interventions

## 2017-08-13 NOTE — Assessment & Plan Note (Signed)
Adequate blood pressure control.  No evidence of new end organ damage.  Tolerating medication without significant adverse effects.  Plan to continue current blood pressure regiment.   

## 2017-08-13 NOTE — Assessment & Plan Note (Signed)
Adequate statin dose for primary prevention. Tolerating medications.  No new end-organ damage.  Continue current medications.

## 2017-08-13 NOTE — Progress Notes (Signed)
   Subjective:    Patient ID: Yehuda BuddDonna L Prisk, female    DOB: [birth year], 66 y.o.   MRN: [id number] Yehuda BuddDonna L Elliott is alone Sources of clinical information for visit is/are patient and past medical records. Nursing assessment for this office visit was reviewed with the patient for accuracy and revision.   Depression screen PHQ 2/9 08/12/2017  Decreased Interest 0  Down, Depressed, Hopeless 0  PHQ - 2 Score 0   Fall Risk  08/12/2017 09/10/2016  Falls in the past year? No No    HPI HYPERTENSION  Disease Monitoring: Blood pressure range-not checking at home Chest pain- no      Dyspnea- no  Medications: Compliance- yes Lightheadedness- no   Edema- no   DIABETES  Disease Monitoring: Blood Sugar ranges-not checking at home  Polyuria- no New Visual problems- no  Medications: Compliance- Lifestyle treatment only Hypoglycemic symptoms- no    HYPERLIPIDEMIA  Disease Monitoring: See symptoms for Hypertension  Medications: Compliance- yes RUQ pain- no  Muscle aches- no    ROS See HPI above   PMH Smoking Status noted       Review of Systems     Objective:   Physical Exam VS noted General: Alert, cooperative, groomed.  Cor: RRR, no Murmur, no JVD Lungs: BCTA, no acc mm use Diabetic Foot Exam - Simple   No data filed     Psych: Normal affect/speech/language Neuro: normal gait      Assessment & Plan:

## 2017-09-16 ENCOUNTER — Other Ambulatory Visit: Payer: Self-pay | Admitting: Family Medicine

## 2017-11-12 ENCOUNTER — Encounter: Payer: Self-pay | Admitting: Family Medicine

## 2017-11-30 DIAGNOSIS — H401131 Primary open-angle glaucoma, bilateral, mild stage: Secondary | ICD-10-CM | POA: Diagnosis not present

## 2018-01-03 ENCOUNTER — Other Ambulatory Visit: Payer: Self-pay | Admitting: Family Medicine

## 2018-01-03 ENCOUNTER — Encounter: Payer: Self-pay | Admitting: Family Medicine

## 2018-01-30 ENCOUNTER — Other Ambulatory Visit: Payer: Self-pay | Admitting: Family Medicine

## 2018-02-03 ENCOUNTER — Other Ambulatory Visit: Payer: Self-pay | Admitting: Family Medicine

## 2018-04-25 ENCOUNTER — Other Ambulatory Visit: Payer: Self-pay | Admitting: Family Medicine

## 2018-04-26 ENCOUNTER — Other Ambulatory Visit: Payer: Self-pay | Admitting: Family Medicine

## 2018-05-31 DIAGNOSIS — H401131 Primary open-angle glaucoma, bilateral, mild stage: Secondary | ICD-10-CM | POA: Diagnosis not present

## 2018-05-31 DIAGNOSIS — H524 Presbyopia: Secondary | ICD-10-CM | POA: Diagnosis not present

## 2018-07-01 ENCOUNTER — Ambulatory Visit (INDEPENDENT_AMBULATORY_CARE_PROVIDER_SITE_OTHER): Payer: Medicare Other

## 2018-07-01 DIAGNOSIS — Z23 Encounter for immunization: Secondary | ICD-10-CM | POA: Diagnosis not present

## 2018-07-01 NOTE — Progress Notes (Signed)
Pt presents in nurse clinic for flu injection. Injection given LD, site unremarkable. Epic and [facility] updated.  

## 2018-08-08 ENCOUNTER — Ambulatory Visit (INDEPENDENT_AMBULATORY_CARE_PROVIDER_SITE_OTHER): Payer: Medicare Other

## 2018-08-08 ENCOUNTER — Encounter (HOSPITAL_COMMUNITY): Payer: Self-pay | Admitting: Emergency Medicine

## 2018-08-08 ENCOUNTER — Ambulatory Visit (HOSPITAL_COMMUNITY)
Admission: EM | Admit: 2018-08-08 | Discharge: 2018-08-08 | Disposition: A | Discharge status: Home / Self Care | Payer: Medicare Other | Attending: Family Medicine | Admitting: Family Medicine

## 2018-08-08 DIAGNOSIS — S52502A Unspecified fracture of the lower end of left radius, initial encounter for closed fracture: Secondary | ICD-10-CM | POA: Diagnosis not present

## 2018-08-08 DIAGNOSIS — W010XXA Fall on same level from slipping, tripping and stumbling without subsequent striking against object, initial encounter: Secondary | ICD-10-CM | POA: Diagnosis not present

## 2018-08-08 DIAGNOSIS — S52602A Unspecified fracture of lower end of left ulna, initial encounter for closed fracture: Secondary | ICD-10-CM

## 2018-08-08 DIAGNOSIS — S52592A Other fractures of lower end of left radius, initial encounter for closed fracture: Secondary | ICD-10-CM | POA: Diagnosis not present

## 2018-08-08 MED ORDER — ACETAMINOPHEN 500 MG PO TABS: 500.0000 mg | ORAL_TABLET | Freq: Four times a day (QID) | ORAL | 0 refills | Status: DC | PRN

## 2018-08-08 MED ORDER — IBUPROFEN 600 MG PO TABS: 600.0000 mg | ORAL_TABLET | Freq: Four times a day (QID) | ORAL | 0 refills | Status: DC | PRN

## 2018-08-08 NOTE — Discharge Instructions (Signed)
Radiology Dg Wrist Complete Left  Result Date: 08/08/2018 CLINICAL DATA:  Left wrist pain after fall today. EXAM: LEFT WRIST - COMPLETE 3+ VIEW COMPARISON:  None. FINDINGS: Mildly displaced ulnar styloid fracture is noted. Mildly angulated distal left radial fracture is noted. Joint spaces are intact. No soft tissue abnormality is noted. IMPRESSION: Mildly angulated distal left radial fracture. Mildly displaced ulnar styloid fracture. Electronically Signed   By: Lupita Raider, M.D.   On: 08/08/2018 15:54     Tylenol and/or ibuprofen as needed for pain.  Ice, elevation to help with swelling.  No weight bearing to left wrist, use of brace and sling as provided. Please call orthopedics- two options provided- to make follow up appointment for definitive treatment.

## 2018-08-08 NOTE — ED Provider Notes (Signed)
[facility]    CSN: [id number] Arrival date & time: 08/08/18  1452     History   Chief Complaint Chief Complaint  Patient presents with  . Wrist Injury    appt 1500    HPI Stefanie Wagner is a 67 y.o. female.   Stefanie Wagner presents with complaints of left wrist pain after a fall this morning. She was getting her jeans on when she tripped and fell onto outstretched left hand. She is left handed. Denies any specific pain but developed swelling and some numbness/tight sensation to hand and wrist. Broke her left middle finger when she was a child, no previous wrist injury. No tingling. Hasn't taken any medications for symptoms. No open skin or lesions from fall. No pain to fingers. Hx of DM, htn, glaucoma. She did take her BP medications today.     ROS per HPI.      Past Medical History:  Diagnosis Date  . Cataract 04/26/2014  . Diabetes mellitus without complication (HCC)   . Diabetes mellitus, controlled (HCC) 06/15/2013   DIABETES GOALS:  Glucose Control: Target HgA1C: < 7  ACE/ARB (if BP >140/80 or proteinuria): No STATIN: Yes LAST EYE EXAM (Annually):  FOOT EXAM (Annually): LDL (less than 100 or 70): Yes MICROALB (last yr):  BMI >25 (nutrition counseling):  Exercise (149min/week):  Yes If smoker, encourage Tobacco Cessation: No Vaccinations: Pneumovac (1x<65, repeat x1 after 5 yr at 52), FLU (yearly), Td:    . Essential hypertension, benign 05/30/2009   Qualifier: Diagnosis of  By: McDiarmid MD, Tawanna Cooler    . Glaucoma 04/26/2014  . Hyperlipidemia LDL goal < 100 06/06/2013    Patient Active Problem List   Diagnosis Date Noted  . Pure hypercholesterolemia 04/18/2015  . Glaucoma 04/26/2014  . Cataract 04/26/2014  . Diabetes mellitus, controlled (HCC) 06/15/2013  . Healthcare maintenance 06/06/2013  . Hyperlipidemia with target LDL less than 100 06/06/2013  . H/O abnormal mammogram 06/01/2013  . Essential hypertension, benign 05/30/2009    History reviewed. No pertinent  surgical history.  OB History   None      Home Medications    Prior to Admission medications   Medication Sig Start Date End Date Taking? Authorizing Provider  acetaminophen (TYLENOL) 500 MG tablet Take 1 tablet (500 mg total) by mouth every 6 (six) hours as needed. 08/08/18   Linus Mako B, NP  amLODipine (NORVASC) 5 MG tablet TAKE 1 TABLET BY MOUTH EVERY DAY 09/16/17   McDiarmid, Leighton Roach, MD  aspirin EC 81 MG tablet Take 1 tablet (81 mg total) by mouth daily. 04/26/14   McDiarmid, Leighton Roach, MD  atorvastatin (LIPITOR) 20 MG tablet TAKE 1 TABLET BY MOUTH EVERY DAY 04/27/18   McDiarmid, Leighton Roach, MD  Biotin 5000 MCG TABS Take 5,000 mcg by mouth daily.    [provider]  ibuprofen (ADVIL,MOTRIN) 600 MG tablet Take 1 tablet (600 mg total) by mouth every 6 (six) hours as needed. 08/08/18   Georgetta Haber, NP  indapamide (LOZOL) 1.25 MG tablet Take 1 tablet (1.25 mg total) by mouth every morning. 11/04/16   McDiarmid, Leighton Roach, MD  indapamide (LOZOL) 1.25 MG tablet TAKE 1 TABLET BY MOUTH EVERY DAY IN THE MORNING 04/25/18   McDiarmid, Leighton Roach, MD  latanoprost (XALATAN) 0.005 % ophthalmic solution Place 1 drop into both eyes at bedtime.    [provider]  Multiple Vitamins-Minerals (CENTRUM SILVER ULTRA WOMENS PO) Take 1 tablet by mouth daily.    [provider]    Family History History reviewed. No pertinent family history.  Social History Social History   Tobacco Use  . Smoking status: Never Smoker  . Smokeless tobacco: Never Used  Substance Use Topics  . Alcohol use: No  . Drug use: No     Allergies   Neomycin; Neosporin [neomycin-bacitracin zn-polymyx]; and Zostavax [zoster vaccine live]   Review of Systems Review of Systems   Physical Exam Triage Vital Signs ED Triage Vitals  Enc Vitals Group     BP 08/08/18 1514 (!) 184/73     Pulse Rate 08/08/18 1514 86     Resp 08/08/18 1514 18     Temp 08/08/18 1514 98.4 F (36.9 C)     Temp Source  08/08/18 1514 Oral     SpO2 08/08/18 1514 100 %     Weight --      Height --      Head Circumference --      Peak Flow --      Pain Score 08/08/18 1524 5     Pain Loc --      Pain Edu? --      Excl. in GC? --    No data found.  Updated Vital Signs BP (!) 184/73 (BP Location: Right Arm)   Pulse 86   Temp 98.4 F (36.9 C) (Oral)   Resp 18   SpO2 100%   Visual Acuity Right Eye Distance:   Left Eye Distance:   Bilateral Distance:    Right Eye Near:   Left Eye Near:    Bilateral Near:     Physical Exam  Constitutional: She is oriented to person, place, and time. She appears well-developed and well-nourished. No distress.  Cardiovascular: Normal rate, regular rhythm and normal heart sounds.  Pulmonary/Chest: Effort normal and breath sounds normal.  Musculoskeletal:       Left wrist: She exhibits decreased range of motion, tenderness, bony tenderness and swelling. She exhibits no effusion, no crepitus, no deformity and no laceration.  No obvious deformity but with significant swelling to left wrist extending to hand; pain with passive flexion and extension of wrist; tender to dorsal wrist and to distal ulna; minimal distal radius pain; strong radial pulse; cap refill < 2 seconds; no pain to fingers or hand on palpation; no snuff box tenderness; moving fingers but limited by swelling, no pain  Neurological: She is alert and oriented to person, place, and time.  Skin: Skin is warm and dry.     UC Treatments / Results  Labs (all labs ordered are listed, but only abnormal results are displayed) Labs Reviewed - No data to display  EKG None  Radiology Dg Wrist Complete Left  Result Date: 08/08/2018 CLINICAL DATA:  Left wrist pain after fall today. EXAM: LEFT WRIST - COMPLETE 3+ VIEW COMPARISON:  None. FINDINGS: Mildly displaced ulnar styloid fracture is noted. Mildly angulated distal left radial fracture is noted. Joint spaces are intact. No soft tissue abnormality is noted.  IMPRESSION: Mildly angulated distal left radial fracture. Mildly displaced ulnar styloid fracture. Electronically Signed   By: Lupita Raider, M.D.   On: 08/08/2018 15:54    Procedures Procedures (including critical care time)  Medications Ordered in UC Medications - No data to display  Initial Impression / Assessment and Plan / UC Course  I have reviewed the triage vital signs and the nursing notes.  Pertinent labs & imaging results that were available during my care of the patient were  reviewed by me and considered in my medical decision making (see chart for details).     Xray consistent with distal ulnar styloid and radial fracture to left wrist. Splint applied and sling provided. Minimal to no pain, tylenol and ibuprofen prn recommended. Ice and elevation to help with swelling. To follow up with hand surgery for definitive treatment. Patient verbalized understanding and agreeable to plan.   Final Clinical Impressions(s) / UC Diagnoses   Final diagnoses:  Closed fracture of distal ends of left radius and ulna, initial encounter     Discharge Instructions     Radiology Dg Wrist Complete Left  Result Date: 08/08/2018 CLINICAL DATA:  Left wrist pain after fall today. EXAM: LEFT WRIST - COMPLETE 3+ VIEW COMPARISON:  None. FINDINGS: Mildly displaced ulnar styloid fracture is noted. Mildly angulated distal left radial fracture is noted. Joint spaces are intact. No soft tissue abnormality is noted. IMPRESSION: Mildly angulated distal left radial fracture. Mildly displaced ulnar styloid fracture. Electronically Signed   By: Lupita Raider, M.D.   On: 08/08/2018 15:54     Tylenol and/or ibuprofen as needed for pain.  Ice, elevation to help with swelling.  No weight bearing to left wrist, use of brace and sling as provided. Please call orthopedics- two options provided- to make follow up appointment for definitive treatment.     ED Prescriptions    Medication Sig Dispense Auth.  Provider   acetaminophen (TYLENOL) 500 MG tablet Take 1 tablet (500 mg total) by mouth every 6 (six) hours as needed. 30 tablet Linus Mako B, NP   ibuprofen (ADVIL,MOTRIN) 600 MG tablet Take 1 tablet (600 mg total) by mouth every 6 (six) hours as needed. 30 tablet Georgetta Haber, NP     Controlled Substance Prescriptions [place] Controlled Substance Registry consulted? Not Applicable   Georgetta Haber, NP 08/08/18 1614

## 2018-08-08 NOTE — ED Triage Notes (Signed)
Pt here with left wrist pain after falling today

## 2018-08-11 DIAGNOSIS — M25532 Pain in left wrist: Secondary | ICD-10-CM | POA: Diagnosis not present

## 2018-08-15 DIAGNOSIS — G8918 Other acute postprocedural pain: Secondary | ICD-10-CM | POA: Diagnosis not present

## 2018-08-15 DIAGNOSIS — S52532A Colles' fracture of left radius, initial encounter for closed fracture: Secondary | ICD-10-CM | POA: Diagnosis not present

## 2018-08-15 DIAGNOSIS — S52552A Other extraarticular fracture of lower end of left radius, initial encounter for closed fracture: Secondary | ICD-10-CM | POA: Diagnosis not present

## 2018-08-15 HISTORY — PX: ORIF RADIAL FRACTURE: SHX5113

## 2018-08-22 ENCOUNTER — Telehealth: Payer: Self-pay

## 2018-08-22 NOTE — Telephone Encounter (Signed)
Please let Stefanie Wagner know that postponing until the Spring will be fine.

## 2018-08-22 NOTE — Telephone Encounter (Signed)
Patient with recent wrist fx and surgery. Would like to postpone her appt this week until in the Spring after she recovers and has PT.  Call back is [phone]  Ples Specter, RN [facility] RN)

## 2018-08-23 DIAGNOSIS — S52532D Colles' fracture of left radius, subsequent encounter for closed fracture with routine healing: Secondary | ICD-10-CM | POA: Diagnosis not present

## 2018-08-23 NOTE — Telephone Encounter (Signed)
Patient aware. Sehar Sedano, RN ([facility] RN)  

## 2018-08-25 ENCOUNTER — Ambulatory Visit: Payer: Medicare Other | Admitting: Family Medicine

## 2018-08-30 ENCOUNTER — Encounter: Payer: Self-pay | Admitting: Family Medicine

## 2018-09-27 DIAGNOSIS — S52532D Colles' fracture of left radius, subsequent encounter for closed fracture with routine healing: Secondary | ICD-10-CM | POA: Diagnosis not present

## 2018-10-06 DIAGNOSIS — M6281 Muscle weakness (generalized): Secondary | ICD-10-CM | POA: Diagnosis not present

## 2018-10-06 DIAGNOSIS — R609 Edema, unspecified: Secondary | ICD-10-CM | POA: Diagnosis not present

## 2018-10-06 DIAGNOSIS — M25642 Stiffness of left hand, not elsewhere classified: Secondary | ICD-10-CM | POA: Diagnosis not present

## 2018-10-06 DIAGNOSIS — M25632 Stiffness of left wrist, not elsewhere classified: Secondary | ICD-10-CM | POA: Diagnosis not present

## 2018-10-10 DIAGNOSIS — M6281 Muscle weakness (generalized): Secondary | ICD-10-CM | POA: Diagnosis not present

## 2018-10-10 DIAGNOSIS — M25632 Stiffness of left wrist, not elsewhere classified: Secondary | ICD-10-CM | POA: Diagnosis not present

## 2018-10-10 DIAGNOSIS — M25642 Stiffness of left hand, not elsewhere classified: Secondary | ICD-10-CM | POA: Diagnosis not present

## 2018-10-10 DIAGNOSIS — R609 Edema, unspecified: Secondary | ICD-10-CM | POA: Diagnosis not present

## 2018-10-14 ENCOUNTER — Other Ambulatory Visit: Payer: Self-pay | Admitting: Family Medicine

## 2018-10-18 DIAGNOSIS — R609 Edema, unspecified: Secondary | ICD-10-CM | POA: Diagnosis not present

## 2018-10-18 DIAGNOSIS — M25642 Stiffness of left hand, not elsewhere classified: Secondary | ICD-10-CM | POA: Diagnosis not present

## 2018-10-18 DIAGNOSIS — M25632 Stiffness of left wrist, not elsewhere classified: Secondary | ICD-10-CM | POA: Diagnosis not present

## 2018-10-18 DIAGNOSIS — M6281 Muscle weakness (generalized): Secondary | ICD-10-CM | POA: Diagnosis not present

## 2018-10-20 DIAGNOSIS — M25632 Stiffness of left wrist, not elsewhere classified: Secondary | ICD-10-CM | POA: Diagnosis not present

## 2018-10-20 DIAGNOSIS — R609 Edema, unspecified: Secondary | ICD-10-CM | POA: Diagnosis not present

## 2018-10-20 DIAGNOSIS — M25642 Stiffness of left hand, not elsewhere classified: Secondary | ICD-10-CM | POA: Diagnosis not present

## 2018-10-20 DIAGNOSIS — M6281 Muscle weakness (generalized): Secondary | ICD-10-CM | POA: Diagnosis not present

## 2018-10-25 DIAGNOSIS — M25632 Stiffness of left wrist, not elsewhere classified: Secondary | ICD-10-CM | POA: Diagnosis not present

## 2018-10-25 DIAGNOSIS — R609 Edema, unspecified: Secondary | ICD-10-CM | POA: Diagnosis not present

## 2018-10-25 DIAGNOSIS — M25642 Stiffness of left hand, not elsewhere classified: Secondary | ICD-10-CM | POA: Diagnosis not present

## 2018-10-25 DIAGNOSIS — M6281 Muscle weakness (generalized): Secondary | ICD-10-CM | POA: Diagnosis not present

## 2018-10-27 DIAGNOSIS — R609 Edema, unspecified: Secondary | ICD-10-CM | POA: Diagnosis not present

## 2018-10-27 DIAGNOSIS — M25632 Stiffness of left wrist, not elsewhere classified: Secondary | ICD-10-CM | POA: Diagnosis not present

## 2018-10-27 DIAGNOSIS — M6281 Muscle weakness (generalized): Secondary | ICD-10-CM | POA: Diagnosis not present

## 2018-10-27 DIAGNOSIS — M25642 Stiffness of left hand, not elsewhere classified: Secondary | ICD-10-CM | POA: Diagnosis not present

## 2018-11-01 DIAGNOSIS — M25642 Stiffness of left hand, not elsewhere classified: Secondary | ICD-10-CM | POA: Diagnosis not present

## 2018-11-01 DIAGNOSIS — M25632 Stiffness of left wrist, not elsewhere classified: Secondary | ICD-10-CM | POA: Diagnosis not present

## 2018-11-01 DIAGNOSIS — R609 Edema, unspecified: Secondary | ICD-10-CM | POA: Diagnosis not present

## 2018-11-01 DIAGNOSIS — M6281 Muscle weakness (generalized): Secondary | ICD-10-CM | POA: Diagnosis not present

## 2018-11-03 DIAGNOSIS — M25642 Stiffness of left hand, not elsewhere classified: Secondary | ICD-10-CM | POA: Diagnosis not present

## 2018-11-03 DIAGNOSIS — M25632 Stiffness of left wrist, not elsewhere classified: Secondary | ICD-10-CM | POA: Diagnosis not present

## 2018-11-03 DIAGNOSIS — R609 Edema, unspecified: Secondary | ICD-10-CM | POA: Diagnosis not present

## 2018-11-03 DIAGNOSIS — M6281 Muscle weakness (generalized): Secondary | ICD-10-CM | POA: Diagnosis not present

## 2018-11-08 DIAGNOSIS — S52532D Colles' fracture of left radius, subsequent encounter for closed fracture with routine healing: Secondary | ICD-10-CM | POA: Diagnosis not present

## 2018-11-28 DIAGNOSIS — H401131 Primary open-angle glaucoma, bilateral, mild stage: Secondary | ICD-10-CM | POA: Diagnosis not present

## 2018-12-29 ENCOUNTER — Telehealth: Payer: Self-pay | Admitting: Family Medicine

## 2018-12-29 NOTE — Telephone Encounter (Signed)
Pt has been called for jury duty on Feb 28, 2019. She needs a letter from her doctor stating she is the caregiver for her daughter, Diandra Hedeen and cannot leave her. Please mail this letter to her home address, which is correct in her chart. Please advise

## 2018-12-30 NOTE — Telephone Encounter (Signed)
Letter for excuse from jury duty sent to [facility] admin for mailing to Ms Raschelle Manjarres.

## 2019-05-30 DIAGNOSIS — H401131 Primary open-angle glaucoma, bilateral, mild stage: Secondary | ICD-10-CM | POA: Diagnosis not present

## 2019-05-30 DIAGNOSIS — H5203 Hypermetropia, bilateral: Secondary | ICD-10-CM | POA: Diagnosis not present

## 2019-06-06 ENCOUNTER — Other Ambulatory Visit: Payer: Self-pay | Admitting: Family Medicine

## 2019-06-26 ENCOUNTER — Other Ambulatory Visit: Payer: Self-pay

## 2019-06-26 ENCOUNTER — Ambulatory Visit (INDEPENDENT_AMBULATORY_CARE_PROVIDER_SITE_OTHER): Payer: Medicare Other | Admitting: *Deleted

## 2019-06-26 DIAGNOSIS — Z23 Encounter for immunization: Secondary | ICD-10-CM | POA: Diagnosis not present

## 2019-06-26 NOTE — Progress Notes (Signed)
Pt tolerated vaccine well. Deseree Blount, CMA  

## 2019-08-28 ENCOUNTER — Other Ambulatory Visit: Payer: Self-pay | Admitting: Family Medicine

## 2019-09-14 ENCOUNTER — Other Ambulatory Visit: Payer: Self-pay | Admitting: Family Medicine

## 2019-09-14 DIAGNOSIS — Z1231 Encounter for screening mammogram for malignant neoplasm of breast: Secondary | ICD-10-CM

## 2019-11-07 ENCOUNTER — Ambulatory Visit: Payer: Medicare Other

## 2019-11-09 IMAGING — DX DG WRIST COMPLETE 3+V*L*
4 series · 4 of 4 positions shown · non-contrast
Comparison: None.

CLINICAL DATA: Left wrist pain after fall today.

EXAM:
LEFT WRIST - COMPLETE 3+ VIEW

[wrist pa]
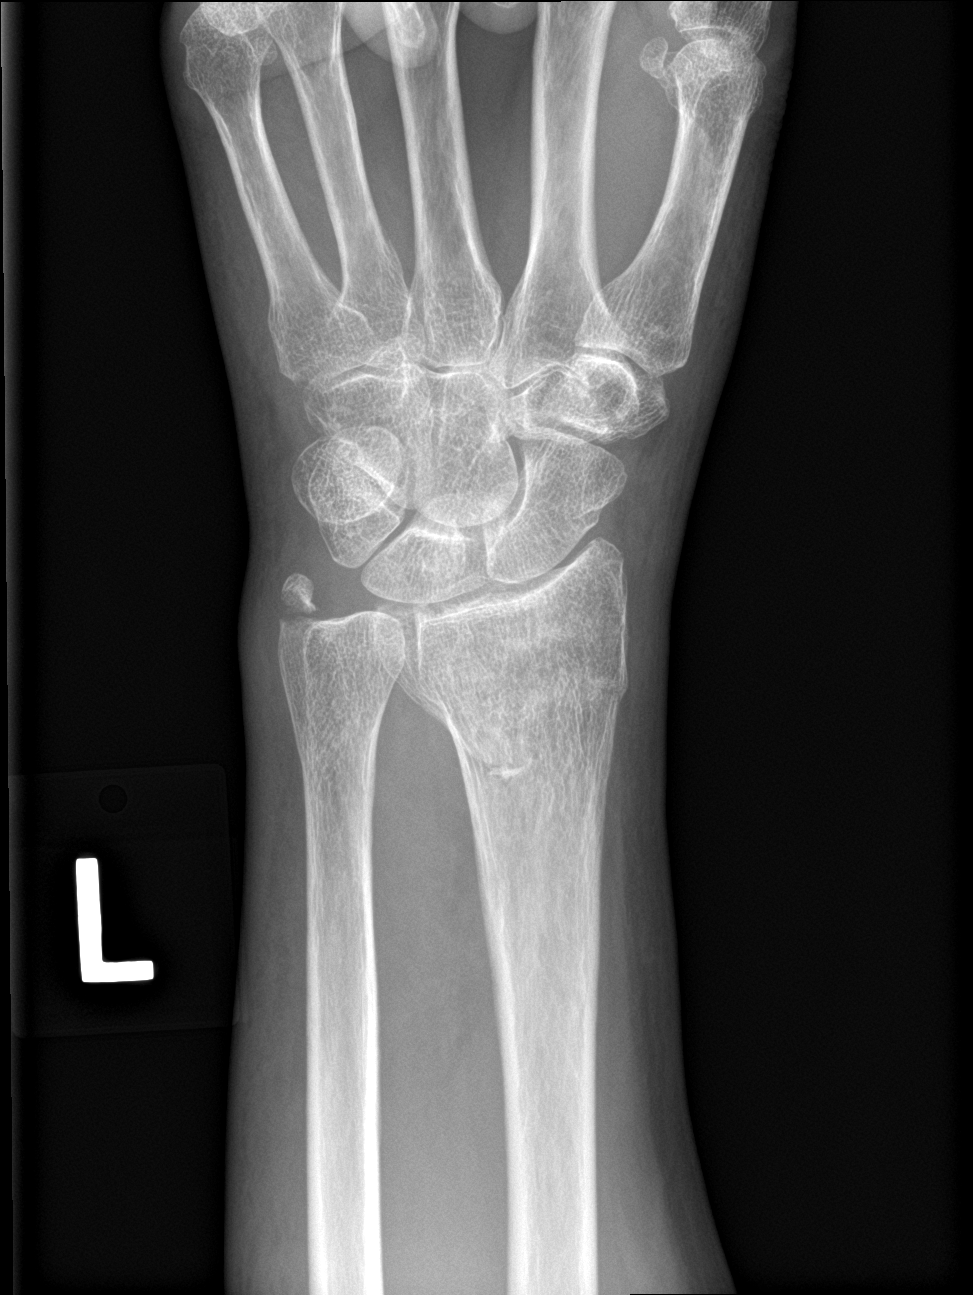

[wrist navicular]
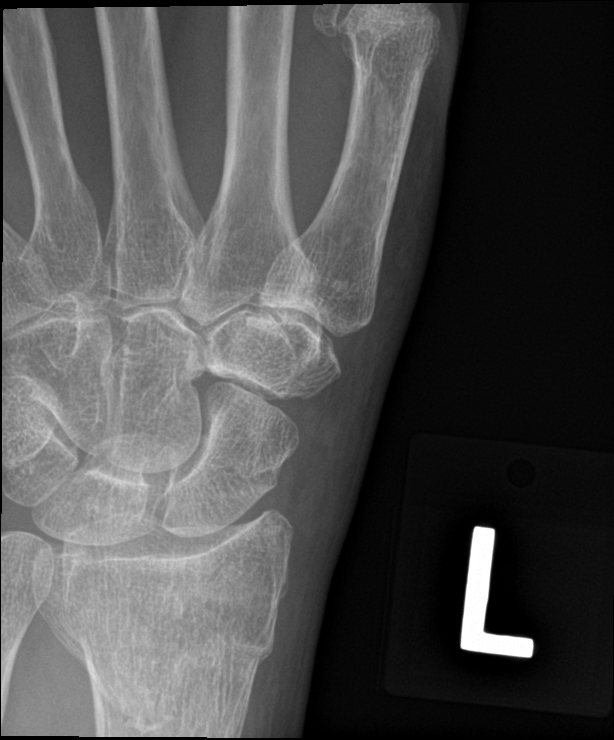

[wrist obl]
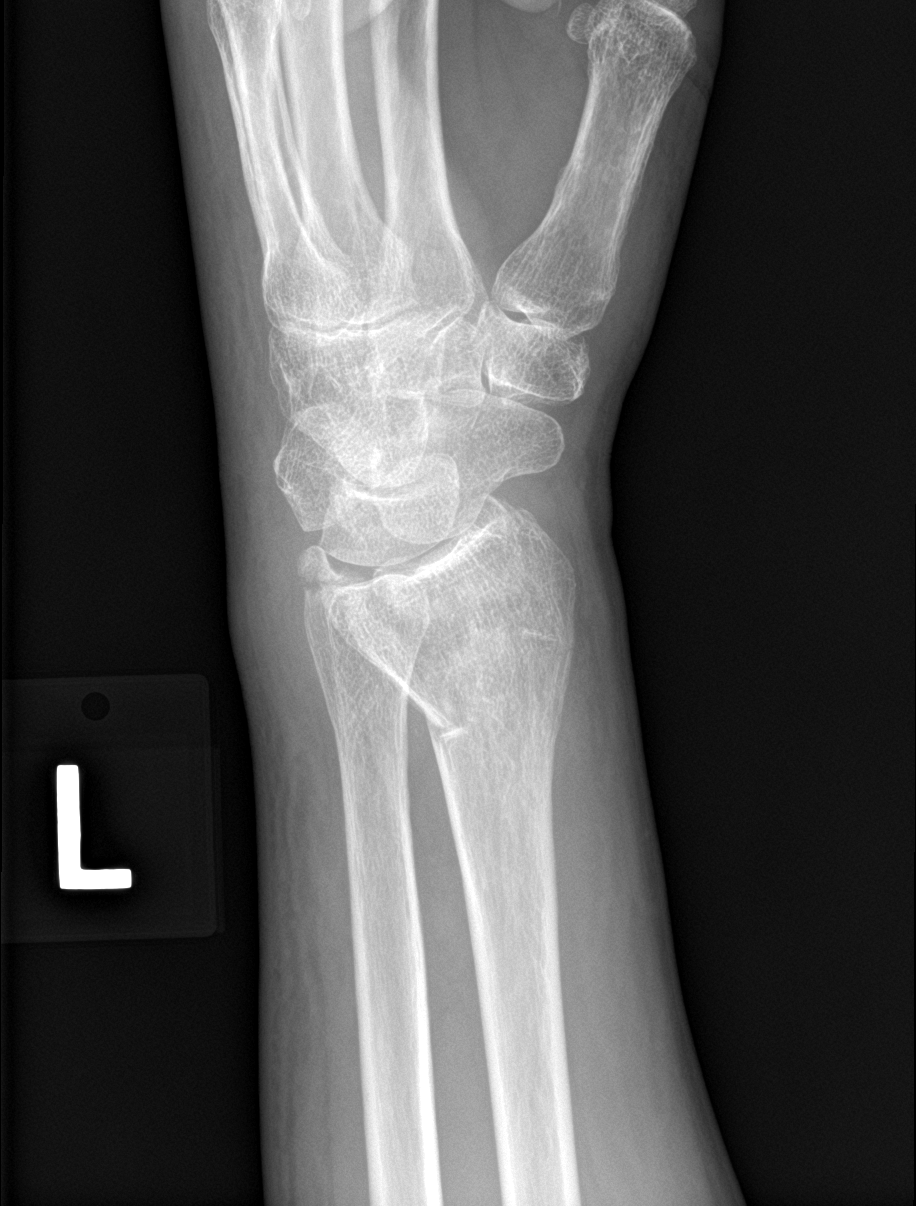

[wrist lat]
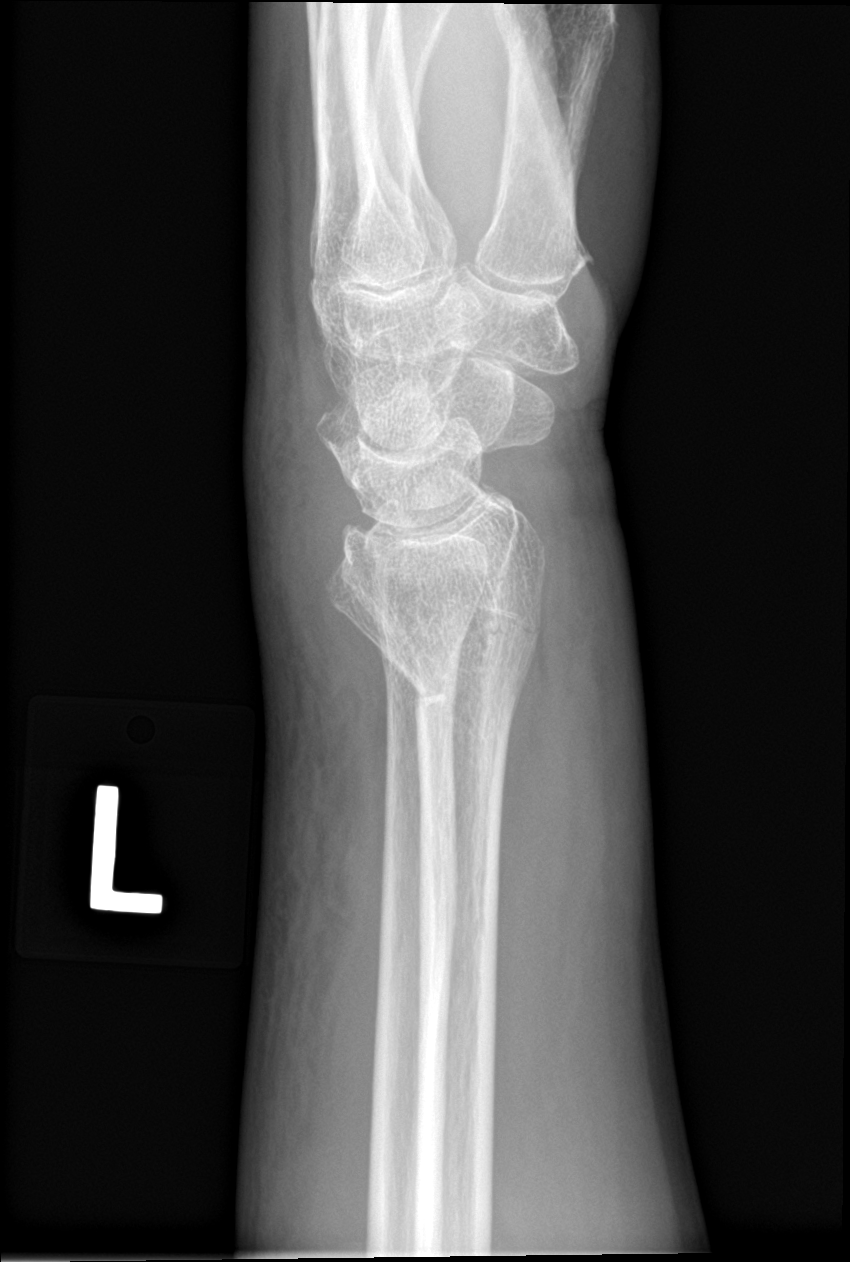

[4 of 4 positions shown; findings below may reference images not displayed]

FINDINGS: Mildly displaced ulnar styloid fracture is noted. Mildly angulated
distal left radial fracture is noted. Joint spaces are intact. No
soft tissue abnormality is noted.
IMPRESSION: Mildly angulated distal left radial fracture. Mildly displaced ulnar
styloid fracture.

## 2019-11-16 ENCOUNTER — Ambulatory Visit: Payer: Medicare Other | Attending: Internal Medicine

## 2019-11-16 ENCOUNTER — Ambulatory Visit: Payer: Medicare Other

## 2019-11-16 DIAGNOSIS — Z23 Encounter for immunization: Secondary | ICD-10-CM | POA: Insufficient documentation

## 2019-11-16 NOTE — Progress Notes (Signed)
   Covid-19 Vaccination Clinic  Name:  Stefanie Wagner    MRN: [id number] DOB: January 30, 1951  11/16/2019  Stefanie Wagner was observed post Covid-19 immunization for 15 minutes without incidence. She was provided with Vaccine Information Sheet and instruction to access the V-Safe system.   Stefanie Wagner was instructed to call 911 with any severe reactions post vaccine: Marland Kitchen Difficulty breathing  . Swelling of your face and throat  . A fast heartbeat  . A bad rash all over your body  . Dizziness and weakness    Immunizations Administered    Name Date Dose VIS Date Route   Pfizer COVID-19 Vaccine 11/16/2019  4:01 PM 0.3 mL 09/22/2019 Intramuscular   Manufacturer: [employer], [employer]   Lot: [id number]   NDC: [address]-6

## 2019-11-28 ENCOUNTER — Ambulatory Visit: Payer: Medicare Other

## 2019-12-01 DIAGNOSIS — H401131 Primary open-angle glaucoma, bilateral, mild stage: Secondary | ICD-10-CM | POA: Diagnosis not present

## 2019-12-11 ENCOUNTER — Ambulatory Visit: Payer: Medicare Other | Attending: Internal Medicine

## 2019-12-11 DIAGNOSIS — Z23 Encounter for immunization: Secondary | ICD-10-CM | POA: Insufficient documentation

## 2019-12-11 LAB — HM DIABETES EYE EXAM

## 2019-12-11 NOTE — Progress Notes (Signed)
   Covid-19 Vaccination Clinic  Name:  Stefanie Wagner    MRN: [id number] DOB: [birth year]  12/11/2019  [name] was observed post Covid-19 immunization for 15 minutes without incidence. She was provided with Vaccine Information Sheet and instruction to access the V-Safe system.   [name] was instructed to call 911 with any severe reactions post vaccine: Marland Kitchen Difficulty breathing  . Swelling of your face and throat  . A fast heartbeat  . A bad rash all over your body  . Dizziness and weakness    Immunizations Administered    Name Date Dose VIS Date Route   Pfizer COVID-19 Vaccine 12/11/2019  4:33 PM 0.3 mL 09/22/2019 Intramuscular   Manufacturer: [employer], [employer]   Lot: [id number]   NDC: [address]-3

## 2019-12-26 ENCOUNTER — Encounter: Payer: Self-pay | Admitting: Family Medicine

## 2020-03-01 DIAGNOSIS — H401131 Primary open-angle glaucoma, bilateral, mild stage: Secondary | ICD-10-CM | POA: Diagnosis not present

## 2020-05-13 ENCOUNTER — Other Ambulatory Visit: Payer: Self-pay | Admitting: Family Medicine

## 2020-06-14 ENCOUNTER — Ambulatory Visit (INDEPENDENT_AMBULATORY_CARE_PROVIDER_SITE_OTHER): Payer: Medicare Other

## 2020-06-14 ENCOUNTER — Other Ambulatory Visit: Payer: Self-pay

## 2020-06-14 DIAGNOSIS — Z23 Encounter for immunization: Secondary | ICD-10-CM

## 2020-06-14 NOTE — Progress Notes (Signed)
Patient presents in flu clinic for flu vaccine.   Vaccine given in RD without complications.  

## 2020-07-19 ENCOUNTER — Other Ambulatory Visit: Payer: Self-pay

## 2020-07-19 ENCOUNTER — Ambulatory Visit (INDEPENDENT_AMBULATORY_CARE_PROVIDER_SITE_OTHER): Payer: Medicare Other

## 2020-07-19 DIAGNOSIS — Z23 Encounter for immunization: Secondary | ICD-10-CM

## 2020-07-19 NOTE — Progress Notes (Signed)
   Covid-19 Vaccination Clinic  Name:  Stefanie Wagner    MRN: [id number] DOB: Jul 21, 1951  07/19/2020   Patient presents to nurse clinic for booster COVID vaccine. Patient denies previous allergic reaction to vaccine, answers no to all screening questions, and meets CDC requirements for booster dose. Administered in RD, site unremarkable, tolerated injection well.   Stefanie Wagner was observed post Covid-19 immunization for 15 minutes without incident. She was provided with Vaccine Information Sheet and instruction to access the V-Safe system.   Stefanie Wagner was instructed to call 911 with any severe reactions post vaccine: Marland Kitchen Difficulty breathing  . Swelling of face and throat  . A fast heartbeat  . A bad rash all over body  . Dizziness and weakness  Patient provided with updated immunization record and card.   Veronda Prude, RN

## 2020-08-05 ENCOUNTER — Other Ambulatory Visit: Payer: Self-pay | Admitting: Family Medicine

## 2020-08-29 DIAGNOSIS — H524 Presbyopia: Secondary | ICD-10-CM | POA: Diagnosis not present

## 2020-08-29 DIAGNOSIS — H401131 Primary open-angle glaucoma, bilateral, mild stage: Secondary | ICD-10-CM | POA: Diagnosis not present

## 2021-02-27 DIAGNOSIS — H2513 Age-related nuclear cataract, bilateral: Secondary | ICD-10-CM | POA: Diagnosis not present

## 2021-02-27 DIAGNOSIS — H401131 Primary open-angle glaucoma, bilateral, mild stage: Secondary | ICD-10-CM | POA: Diagnosis not present

## 2021-03-11 ENCOUNTER — Other Ambulatory Visit: Payer: Self-pay | Admitting: Family Medicine

## 2021-06-19 ENCOUNTER — Other Ambulatory Visit: Payer: Self-pay | Admitting: General Practice

## 2021-06-19 ENCOUNTER — Ambulatory Visit
Admission: RE | Admit: 2021-06-19 | Discharge: 2021-06-19 | Disposition: A | Discharge status: Home / Self Care | Payer: Medicare Other | Source: Ambulatory Visit | Attending: General Practice | Admitting: General Practice

## 2021-06-19 ENCOUNTER — Other Ambulatory Visit: Payer: Self-pay

## 2021-06-19 ENCOUNTER — Ambulatory Visit (INDEPENDENT_AMBULATORY_CARE_PROVIDER_SITE_OTHER): Payer: Medicare Other | Admitting: Family Medicine

## 2021-06-19 ENCOUNTER — Encounter: Payer: Self-pay | Admitting: Family Medicine

## 2021-06-19 VITALS — BP 166/107 | HR 110 | Ht 66.0 in | Wt 133.4 lb

## 2021-06-19 DIAGNOSIS — I152 Hypertension secondary to endocrine disorders: Secondary | ICD-10-CM

## 2021-06-19 DIAGNOSIS — Z79899 Other long term (current) drug therapy: Secondary | ICD-10-CM | POA: Diagnosis not present

## 2021-06-19 DIAGNOSIS — Z23 Encounter for immunization: Secondary | ICD-10-CM | POA: Diagnosis not present

## 2021-06-19 DIAGNOSIS — E1159 Type 2 diabetes mellitus with other circulatory complications: Secondary | ICD-10-CM

## 2021-06-19 DIAGNOSIS — Z0000 Encounter for general adult medical examination without abnormal findings: Secondary | ICD-10-CM

## 2021-06-19 DIAGNOSIS — E1165 Type 2 diabetes mellitus with hyperglycemia: Secondary | ICD-10-CM

## 2021-06-19 DIAGNOSIS — E1169 Type 2 diabetes mellitus with other specified complication: Secondary | ICD-10-CM | POA: Diagnosis not present

## 2021-06-19 DIAGNOSIS — T182XXA Foreign body in stomach, initial encounter: Secondary | ICD-10-CM

## 2021-06-19 DIAGNOSIS — E785 Hyperlipidemia, unspecified: Secondary | ICD-10-CM | POA: Diagnosis not present

## 2021-06-19 DIAGNOSIS — T189XXA Foreign body of alimentary tract, part unspecified, initial encounter: Secondary | ICD-10-CM | POA: Diagnosis not present

## 2021-06-19 DIAGNOSIS — E119 Type 2 diabetes mellitus without complications: Secondary | ICD-10-CM

## 2021-06-19 LAB — POCT GLYCOSYLATED HEMOGLOBIN (HGB A1C): HbA1c, POC (controlled diabetic range): 12.7 % — AB (ref 0.0–7.0)

## 2021-06-19 MED ORDER — AMLODIPINE BESYLATE 10 MG PO TABS: 10.0000 mg | ORAL_TABLET | Freq: Every day | ORAL | 3 refills | Status: DC

## 2021-06-19 MED ORDER — METFORMIN HCL ER 500 MG PO TB24: ORAL_TABLET | ORAL | 1 refills | Status: DC

## 2021-06-19 MED ORDER — INDAPAMIDE 1.25 MG PO TABS: 1.2500 mg | ORAL_TABLET | Freq: Every day | ORAL | 3 refills | Status: DC

## 2021-06-19 MED ORDER — ATORVASTATIN CALCIUM 20 MG PO TABS
20.0000 mg | ORAL_TABLET | Freq: Every day | ORAL | 3 refills | Status: DC
Start: 2021-06-19 — End: 2022-05-22

## 2021-06-19 NOTE — Assessment & Plan Note (Signed)
Increase amlodipine from 5mg  daily to 10mg  daily.  RTC in 3 months for follow-up.

## 2021-06-19 NOTE — Assessment & Plan Note (Signed)
HbA1C today at 12.7. Patient notes she has fallen off the diet she knows works due to grief around her daughter's death in 2019/02/08. Initiating metformin to help lower HbA1C while Koree makes lifestyle modifications. Starting with 500 mg for 7 days, then 1000 mg for 7 days, and then 1500 mg daily. RTC in 3 months for follow-up and HbA1C check.  Last documented HbA1C from 02-07-2017 at 6.0, controlled with diet.

## 2021-06-19 NOTE — Patient Instructions (Signed)
I  am glad I got to talk with you  about Stefanie Wagner.  She is missed.   Increase the amlodipine to 10 mg tablet, one tablet at night.  Take the indapamide in the morning.   Start the metformin XR 500 mg tablet, one tablet each morning for 7 day, then two tablets each morning for 7 days, and finally 3 tablets each morning until we see you again in 3 months.    We are checking your kidney's and electrolytes and cholesterol today.

## 2021-06-19 NOTE — Progress Notes (Signed)
    SUBJECTIVE:   CHIEF COMPLAINT / HPI:   Stefanie Wagner comes in today for a follow-up for diabetes.  Diabetes - Previously controlled via diet. HbA1C was 12.7 today. She was expecting it to be high because she has been eating sweets the past few years as part of a coping mechanism. She knows what she needs to do to get it lower and is well aware she hasn't been doing it.   Hypertension - Taking 5 mg amlodipine and 1.35 mg indapamide.Was elevated in office today 166/70. Makennah shares in general her heart rate increases everytime she goes by the hospital after her daughter's death. Repeat BP at the end of the visit was 160/90 with elevated HR.  Hyperlipidemia - Currently taking 20mg  lipitor  Mood  - Sarinity lost her daughter in January 24, 2019 and has been struggling with sadness. She is adamant she is not depressed - she has no thoughts of harming herself or leaving this world and while she is overall sad, finds relief in going outside and speaking to best friends. Many things, especially in the house, remind her of her daughter which has been difficult. They haven't celebrated Christmas the past 2 years because it is too painful. She does not want to talk to a counselor and has people in her life who she feels are a good support system for her. - Hiawatha is struggling with guilt around whether she did enough for her daughter. She feels responsible that she somehow missed her daughter's pneumonia or didn't get help soon enough when her daughter was young. We assured her that is not the case and that she did everything possible, and that her daughter had the best mother.  PERTINENT  PMH / PSH:  Diabetes Hyperlipidemia  OBJECTIVE:   BP (!) 166/107   Pulse (!) 110   Ht 5\' 6"  (1.676 m)   Wt 133 lb 6.4 oz (60.5 kg)   SpO2 98%   BMI 21.53 kg/m   GEN: Well appearing, in no acute distress, alert and oriented CARD: Slightly elevated rate, PACs auscultated. No murmurs or gallops PULM: Clear to ascultation SKIN:  Warm. dry EXTREMITIES: No edema. NEUR: Grossly normal PSYCH: Mood as "sad." Endorses decreased interest in activities but mood improves when spending time with friends and outdoors. Full affect. Normal speech. Denies SI/HI. FOOT: Bunion formation bilaterally.  ASSESSMENT/PLAN:   Hypertension associated with diabetes (HCC) Increase amlodipine from 5mg  daily to 10mg  daily.  RTC in 3 months for follow-up.  Diabetes mellitus, controlled (HCC) HbA1C today at 12.7. Patient notes she has fallen off the diet she knows works due to grief around her daughter's death in 01-24-2019. Initiating metformin to help lower HbA1C while L[place]a makes lifestyle modifications. Starting with 500 mg for 7 days, then 1000 mg for 7 days, and then 1500 mg daily. RTC in 3 months for follow-up and HbA1C check.  Last documented HbA1C from 01/23/17 at 6.0, controlled with diet.   Mood PHQ-9 score of 3 today. Gaby does not want a counselor to talk to at this time. She has a best friend she calls daily.  Health Maintenance Administered flu vaccine Advised patient to keep an eye out for community advertisements for bivalent booster vaccine  2021, Medical Student [facility]

## 2021-06-20 ENCOUNTER — Encounter: Payer: Self-pay | Admitting: Family Medicine

## 2021-06-20 LAB — LIPID PANEL
Chol/HDL Ratio: 4.4 ratio (ref 0.0–4.4)
Cholesterol, Total: 172 mg/dL (ref 100–199)
HDL: 39 mg/dL — ABNORMAL LOW (ref >39)
LDL Chol Calc (NIH): 101 mg/dL — ABNORMAL HIGH (ref 0–99)
Triglycerides: 182 mg/dL — ABNORMAL HIGH (ref 0–149)
VLDL Cholesterol Cal: 32 mg/dL (ref 5–40)

## 2021-06-20 LAB — BASIC METABOLIC PANEL
BUN/Creatinine Ratio: 18 (ref 12–28)
BUN: 15 mg/dL (ref 8–27)
CO2: 27 mmol/L (ref 20–29)
Calcium: 10.4 mg/dL — ABNORMAL HIGH (ref 8.7–10.3)
Chloride: 91 mmol/L — ABNORMAL LOW (ref 96–106)
Creatinine, Ser: 0.83 mg/dL (ref 0.57–1.00)
Glucose: 426 mg/dL — ABNORMAL HIGH (ref 65–99)
Potassium: 4 mmol/L (ref 3.5–5.2)
Sodium: 136 mmol/L (ref 134–144)
eGFR: 76 mL/min/{1.73_m2} (ref >59)

## 2021-06-20 NOTE — Assessment & Plan Note (Signed)
Need to remind patient to stop ASA at next office visit Patient declined colonoscopy, Shingrix, DEXA, Prevnar-20, mammography,   She was reminded to have her annual diabetic eye exam and follow up of her glaucoma.

## 2021-06-20 NOTE — Assessment & Plan Note (Signed)
Established problem patient tolerating atorvastatin Lipids checked today.  Continue current drug therapy

## 2021-06-20 NOTE — Progress Notes (Signed)
I have interviewed and examined the patient with MS T. Tomasa Blase     I agree with their documentation and management in their note for today.   Mrs Yasin continues to experience grief from her daughters death.  She thinks about death, but denies both a plan or intent to harm herself.  She has not significant risk factors for suicide.  She identifies her faith and family as reasons she would not harm herself.  She declined counseling resources.  She agreed to contact us should she change her mind about counseling, or develops thoughts of harming herself.   Will check urine protein:creatinine ratio at next office visit in 3 months.   Patient's hypertension is an established problem that is uncontrolled.   Patient's DMT2 is an established problem that is uncontrolled. See MS T. Santanam's note for today for details of interventions for hypertension and diabetes mellitus.

## 2021-07-01 ENCOUNTER — Other Ambulatory Visit: Payer: Self-pay | Admitting: Family Medicine

## 2021-08-15 DIAGNOSIS — H2513 Age-related nuclear cataract, bilateral: Secondary | ICD-10-CM | POA: Diagnosis not present

## 2021-08-15 DIAGNOSIS — H401131 Primary open-angle glaucoma, bilateral, mild stage: Secondary | ICD-10-CM | POA: Diagnosis not present

## 2021-08-28 LAB — HM DIABETES EYE EXAM

## 2021-09-09 ENCOUNTER — Encounter: Payer: Self-pay | Admitting: Family Medicine

## 2021-09-09 DIAGNOSIS — H259 Unspecified age-related cataract: Secondary | ICD-10-CM

## 2021-09-09 DIAGNOSIS — H401130 Primary open-angle glaucoma, bilateral, stage unspecified: Secondary | ICD-10-CM

## 2021-09-10 ENCOUNTER — Encounter: Payer: Self-pay | Admitting: Family Medicine

## 2021-09-10 DIAGNOSIS — Z5320 Procedure and treatment not carried out because of patient's decision for unspecified reasons: Secondary | ICD-10-CM

## 2021-09-10 HISTORY — DX: Procedure and treatment not carried out because of patient's decision for unspecified reasons: Z53.20

## 2021-09-11 ENCOUNTER — Ambulatory Visit (INDEPENDENT_AMBULATORY_CARE_PROVIDER_SITE_OTHER): Payer: Medicare Other | Admitting: Family Medicine

## 2021-09-11 ENCOUNTER — Other Ambulatory Visit: Payer: Self-pay

## 2021-09-11 ENCOUNTER — Encounter: Payer: Self-pay | Admitting: Family Medicine

## 2021-09-11 VITALS — BP 143/86 | HR 93 | Ht 66.0 in | Wt 135.0 lb

## 2021-09-11 DIAGNOSIS — E1136 Type 2 diabetes mellitus with diabetic cataract: Secondary | ICD-10-CM

## 2021-09-11 DIAGNOSIS — I152 Hypertension secondary to endocrine disorders: Secondary | ICD-10-CM | POA: Diagnosis not present

## 2021-09-11 DIAGNOSIS — E1139 Type 2 diabetes mellitus with other diabetic ophthalmic complication: Secondary | ICD-10-CM | POA: Insufficient documentation

## 2021-09-11 DIAGNOSIS — E1159 Type 2 diabetes mellitus with other circulatory complications: Secondary | ICD-10-CM | POA: Diagnosis not present

## 2021-09-11 DIAGNOSIS — E1165 Type 2 diabetes mellitus with hyperglycemia: Secondary | ICD-10-CM | POA: Diagnosis not present

## 2021-09-11 LAB — POCT GLYCOSYLATED HEMOGLOBIN (HGB A1C): HbA1c, POC (controlled diabetic range): 7.2 % — AB (ref 0.0–7.0)

## 2021-09-11 NOTE — Assessment & Plan Note (Signed)
Established problem Well Controlled. No signs of complications, medication side effects, or red flags. Continue current medications and other regiments.  

## 2021-09-11 NOTE — Progress Notes (Signed)
Stefanie Wagner is alone Sources of clinical information for visit is/are patient and past medical records. Nursing assessment for this office visit was reviewed with the patient for accuracy and revision.     Previous Report(s) Reviewed: none  Depression screen PHQ 2/9 09/11/2021  Decreased Interest 0  Down, Depressed, Hopeless 0  PHQ - 2 Score 0  Altered sleeping 0  Tired, decreased energy 0  Change in appetite 0  Feeling bad or failure about yourself  0  Trouble concentrating 0  Moving slowly or fidgety/restless 0  Suicidal thoughts 0  PHQ-9 Score 0  Difficult doing work/chores -   Flowsheet Row Office Visit from 09/11/2021 in [facility] Office Visit from 06/19/2021 in [facility]  Thoughts that you would be better off dead, or of hurting yourself in some way Not at all Not at all  PHQ-9 Total Score 0 3       Fall Risk  09/11/2021 06/19/2021 08/12/2017 09/10/2016  Falls in the past year? 0 0 No No  Number falls in past yr: 0 0 - -  Injury with Fall? 0 0 - -    PHQ9 SCORE ONLY 09/11/2021 06/19/2021 08/12/2017  PHQ-9 Total Score 0 3 0    Adult vaccines due  Topic Date Due   TETANUS/TDAP  05/31/2019    Health Maintenance Due  Topic Date Due   URINE MICROALBUMIN  Never done   FOOT EXAM  09/10/2017   Pneumonia Vaccine 34+ Years old (3 - PPSV23 if available, else PCV20) 05/24/2019   TETANUS/TDAP  05/31/2019   COVID-19 Vaccine (5 - Booster for Pfizer series) 08/16/2021      History/P.E. limitations: none  Adult vaccines due  Topic Date Due   TETANUS/TDAP  05/31/2019    Diabetes Health Maintenance Due  Topic Date Due   URINE MICROALBUMIN  Never done   FOOT EXAM  09/10/2017   HEMOGLOBIN A1C  03/12/2022   OPHTHALMOLOGY EXAM  08/28/2022    Health Maintenance Due  Topic Date Due   URINE MICROALBUMIN  Never done   FOOT EXAM  09/10/2017   Pneumonia Vaccine 49+ Years old (3 - PPSV23 if available, else PCV20) 05/24/2019    TETANUS/TDAP  05/31/2019   COVID-19 Vaccine (5 - Booster for Pfizer series) 08/16/2021     Chief Complaint  Patient presents with   Follow-up

## 2021-09-11 NOTE — Assessment & Plan Note (Signed)
Established problem that has improved.  Lab Results  Component Value Date   HGBA1C 7.2 (A) 09/11/2021    Continue metformin 1500 mg daily

## 2021-09-11 NOTE — Patient Instructions (Signed)
YOur A1c is excellent at 7.2 %.  Great Job!!  Keep taking your metformin three tablets a day.   We will talk about getting the pneumonia vaccination at your next office visit in 6 months.

## 2021-09-29 ENCOUNTER — Other Ambulatory Visit: Payer: Self-pay | Admitting: Family Medicine

## 2021-09-29 DIAGNOSIS — E1165 Type 2 diabetes mellitus with hyperglycemia: Secondary | ICD-10-CM

## 2022-03-03 DIAGNOSIS — H401131 Primary open-angle glaucoma, bilateral, mild stage: Secondary | ICD-10-CM | POA: Diagnosis not present

## 2022-03-17 ENCOUNTER — Encounter: Payer: Self-pay | Admitting: *Deleted

## 2022-04-02 ENCOUNTER — Ambulatory Visit (INDEPENDENT_AMBULATORY_CARE_PROVIDER_SITE_OTHER): Payer: Medicare Other | Admitting: Family Medicine

## 2022-04-02 ENCOUNTER — Encounter: Payer: Self-pay | Admitting: Family Medicine

## 2022-04-02 VITALS — BP 154/83 | HR 91 | Ht 66.0 in | Wt 134.0 lb

## 2022-04-02 DIAGNOSIS — E1136 Type 2 diabetes mellitus with diabetic cataract: Secondary | ICD-10-CM

## 2022-04-02 DIAGNOSIS — Z23 Encounter for immunization: Secondary | ICD-10-CM | POA: Diagnosis not present

## 2022-04-02 DIAGNOSIS — E1159 Type 2 diabetes mellitus with other circulatory complications: Secondary | ICD-10-CM

## 2022-04-02 DIAGNOSIS — E1169 Type 2 diabetes mellitus with other specified complication: Secondary | ICD-10-CM | POA: Diagnosis not present

## 2022-04-02 DIAGNOSIS — I152 Hypertension secondary to endocrine disorders: Secondary | ICD-10-CM | POA: Diagnosis not present

## 2022-04-02 DIAGNOSIS — E785 Hyperlipidemia, unspecified: Secondary | ICD-10-CM

## 2022-04-02 LAB — POCT GLYCOSYLATED HEMOGLOBIN (HGB A1C): HbA1c, POC (controlled diabetic range): 7.1 % — AB (ref 0.0–7.0)

## 2022-04-02 LAB — POCT UA - MICROALBUMIN
Albumin/Creatinine Ratio, Urine, POC: <30
Creatinine, POC: 50 mg/dL
Microalbumin Ur, POC: 10 mg/L

## 2022-04-02 NOTE — Patient Instructions (Signed)
Your blood pressure is up a little more than I would like.  Please increase your Indapamide to two tablets at a time, every day.  Continue taking your Amlodipine, and your Atorvastatin.    We are checking your urine for albumin to see if your kidneys are damage.

## 2022-04-06 ENCOUNTER — Encounter: Payer: Self-pay | Admitting: Family Medicine

## 2022-04-06 NOTE — Progress Notes (Signed)
Stefanie Wagner is alone Sources of clinical information for visit is/are patient. Nursing assessment for this office visit was reviewed with the patient for accuracy and revision.     Previous Report(s) Reviewed: none     04/02/2022    3:08 PM  Depression screen PHQ 2/9  Decreased Interest 0  Down, Depressed, Hopeless 0  PHQ - 2 Score 0  Altered sleeping 0  Tired, decreased energy 0  Change in appetite 0  Feeling bad or failure about yourself  0  Trouble concentrating 0  Moving slowly or fidgety/restless 0  Suicidal thoughts 0  PHQ-9 Score 0  Difficult doing work/chores Not difficult at all   [employer] Office Visit from 04/02/2022 in [facility] Office Visit from 09/11/2021 in [facility] Office Visit from 06/19/2021 in [facility]  Thoughts that you would be better off dead, or of hurting yourself in some way Not at all Not at all Not at all  PHQ-9 Total Score 0 0 3          04/02/2022    3:08 PM 09/11/2021    9:28 AM 06/19/2021    9:34 AM 08/12/2017   10:49 AM 09/10/2016   10:49 AM  Fall Risk   Falls in the past year? 0 0 0 No No  Number falls in past yr: 0 0 0    Injury with Fall? 0 0 0         04/02/2022    3:08 PM 09/11/2021    9:27 AM 06/19/2021    9:34 AM  PHQ9 SCORE ONLY  PHQ-9 Total Score 0 0 3    Adult vaccines due  Topic Date Due   TETANUS/TDAP  05/31/2019    Health Maintenance Due  Topic Date Due   FOOT EXAM  09/10/2017   TETANUS/TDAP  05/31/2019   COVID-19 Vaccine (5 - Booster for Pfizer series) 08/16/2021      History/P.E. limitations: none  Adult vaccines due  Topic Date Due   TETANUS/TDAP  05/31/2019    Diabetes Health Maintenance Due  Topic Date Due   FOOT EXAM  09/10/2017   OPHTHALMOLOGY EXAM  08/28/2022   HEMOGLOBIN A1C  10/02/2022   URINE MICROALBUMIN  04/03/2023    Health Maintenance Due  Topic Date Due   FOOT EXAM  09/10/2017   TETANUS/TDAP  05/31/2019    COVID-19 Vaccine (5 - Booster for Pfizer series) 08/16/2021     Chief Complaint  Patient presents with   Diabetes   Hypertension

## 2022-04-16 ENCOUNTER — Encounter: Payer: Self-pay | Admitting: Family Medicine

## 2022-04-16 LAB — HM DIABETES EYE EXAM

## 2022-05-04 ENCOUNTER — Telehealth: Payer: Self-pay

## 2022-05-04 DIAGNOSIS — E1159 Type 2 diabetes mellitus with other circulatory complications: Secondary | ICD-10-CM

## 2022-05-04 MED ORDER — INDAPAMIDE 1.25 MG PO TABS: 2.5000 mg | ORAL_TABLET | Freq: Every day | ORAL | 3 refills | Status: DC

## 2022-05-04 NOTE — Telephone Encounter (Signed)
Patient calls nurse line requesting a refill on indapamide.   Patient reports she was told to let PCP know she is ready for prescription to be written for 2 tabs per day.   Will forward to PCP.

## 2022-05-22 ENCOUNTER — Other Ambulatory Visit: Payer: Self-pay | Admitting: Family Medicine

## 2022-05-22 DIAGNOSIS — E785 Hyperlipidemia, unspecified: Secondary | ICD-10-CM

## 2022-05-22 DIAGNOSIS — E1159 Type 2 diabetes mellitus with other circulatory complications: Secondary | ICD-10-CM

## 2022-08-10 ENCOUNTER — Ambulatory Visit (HOSPITAL_COMMUNITY)
Admission: RE | Admit: 2022-08-10 | Discharge: 2022-08-10 | Disposition: A | Discharge status: Home / Self Care | Payer: Medicare Other | Source: Ambulatory Visit | Attending: Emergency Medicine | Admitting: Emergency Medicine

## 2022-08-10 ENCOUNTER — Ambulatory Visit (INDEPENDENT_AMBULATORY_CARE_PROVIDER_SITE_OTHER): Payer: Medicare Other

## 2022-08-10 ENCOUNTER — Encounter (HOSPITAL_COMMUNITY): Payer: Self-pay

## 2022-08-10 VITALS — BP 171/89 | HR 103 | Temp 98.1°F | Resp 18

## 2022-08-10 DIAGNOSIS — W19XXXA Unspecified fall, initial encounter: Secondary | ICD-10-CM

## 2022-08-10 DIAGNOSIS — M25531 Pain in right wrist: Secondary | ICD-10-CM | POA: Diagnosis not present

## 2022-08-10 DIAGNOSIS — M7989 Other specified soft tissue disorders: Secondary | ICD-10-CM | POA: Diagnosis not present

## 2022-08-10 DIAGNOSIS — M25431 Effusion, right wrist: Secondary | ICD-10-CM | POA: Diagnosis not present

## 2022-08-10 NOTE — ED Provider Notes (Signed)
[place]    CSN: [id number] Arrival date & time: 08/10/22  1003      History   Chief Complaint Chief Complaint  Patient presents with   Wrist Pain    I fell Sunday morning on our home ramp.  My wrist is now hurting and swollen. - Entered by patient    HPI Stefanie Wagner is a 71 y.o. female.  Presents after a fall yesterday Landed on her right side. Did not hit head or lose consciousness Denies pain but noticed swelling in her right wrist Full ROM. Wanted to make sure nothing was broken  Past Medical History:  Diagnosis Date   Cataract 04/26/2014   Diabetes mellitus without complication ([place])    Diabetes mellitus, controlled ([place]) 06/15/2013   DIABETES GOALS:  Glucose Control: Target HgA1C: < 7  ACE/ARB (if BP >140/80 or proteinuria): No STATIN: Yes LAST EYE EXAM (Annually):  FOOT EXAM (Annually): LDL (less than 100 or 70): Yes MICROALB (last yr):  BMI >25 (nutrition counseling):  Exercise (127min/week):  Yes If smoker, encourage Tobacco Cessation: No Vaccinations: Pneumovac (1x<65, repeat x1 after 5 yr at 77), FLU (yearly), Td:     Essential hypertension, benign 05/30/2009   Qualifier: Diagnosis of  By: McDiarmid MD, Astrid Drafts 04/26/2014   H/O abnormal mammogram 06/01/2013   05/19/13: abnormal screening mammogram. R breast. 06/26/13: diagnostic mammogram > calcification. Recommend Stereotactic-guided biopsy to r/o DCIS.  06/30/2013: Stereotactic-guided biopsy of right breast > fibrocystic changes and calcification. Recommendation screening mammogram in one year.      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      Home Medications    Prior to Admission medications   Medication Sig Start Date End Date Taking? Authorizing Provider  acetaminophen (TYLENOL) 500 MG tablet Take 1 tablet (500 mg total) by mouth every 6 (six) hours as needed. 08/08/18   Augusto Gamble B, NP  amLODipine (NORVASC) 10 MG tablet TAKE 1 TABLET BY MOUTH EVERYDAY AT BEDTIME 05/22/22   McDiarmid, Blane Ohara, MD  aspirin EC 81 MG tablet Take 1 tablet (81 mg total) by mouth daily. 04/26/14   McDiarmid, Blane Ohara, MD  atorvastatin (LIPITOR) 20 MG tablet TAKE 1 TABLET BY MOUTH EVERY DAY 05/22/22   McDiarmid, Blane Ohara, MD  Biotin 5000 MCG TABS Take 5,000 mcg by mouth daily.    [provider]  indapamide (LOZOL) 1.25 MG tablet Take 2 tablets (2.5 mg total) by mouth daily. 05/04/22 04/29/23  McDiarmid, Blane Ohara, MD  latanoprost (XALATAN) 0.005 % ophthalmic solution Place 1 drop into both eyes at bedtime.    [provider]  metFORMIN (GLUCOPHAGE-XR) 500 MG 24 hr tablet Take 3 tablets (1,500 mg total) by mouth daily with breakfast. 09/29/21   McDiarmid, Blane Ohara, MD  Multiple Vitamins-Minerals (CENTRUM SILVER ULTRA WOMENS PO) Take 1 tablet by mouth daily.    [provider]    [facility]  History History reviewed. No pertinent family history.  Social History Social History   Tobacco Use   Smoking status: Never   Smokeless tobacco: Never  Substance Use Topics   Alcohol use: No   Drug use: No     Allergies   Neomycin, Neosporin [neomycin-bacitracin zn-polymyx], and Zostavax [zoster vaccine live]   Review of Systems Review of Systems Per HPI  Physical Exam Triage Vital Signs ED Triage Vitals  Enc Vitals Group     BP 08/10/22 1059 (!) 171/89     Pulse Rate 08/10/22 1059 (!) 103     Resp 08/10/22 1059 18     Temp  08/10/22 1059 98.1 F (36.7 C)     Temp Source 08/10/22 1059 Oral     SpO2 08/10/22 1059 94 %     Weight --      Height --      Head Circumference --      Peak Flow --      Pain Score 08/10/22 1100 0     Pain Loc --      Pain Edu? --      Excl. in [place]? --    No data found.  Updated Vital Signs BP (!) 171/89 (BP Location: Right Arm)   Pulse (!) 103   Temp 98.1 F (36.7 C) (Oral)   Resp 18   SpO2 94%    Physical Exam Vitals and nursing note reviewed.  Constitutional:      General: She is not in acute distress.    Appearance: Normal appearance.  Cardiovascular:     Rate and Rhythm: Normal rate.     Pulses: Normal pulses.  Pulmonary:     Effort: Pulmonary effort is normal.  Musculoskeletal:        General: No tenderness or deformity. Normal range of motion.     Comments: Minimal almost unnoticeable amount of swelling to right dorsal hand/wrist. Non tender to palpation. No deformity or bruising. Strong radial pulse. Sensation intact  Skin:    Capillary Refill: Capillary refill takes less than 2 seconds.  Neurological:     Mental Status: She is alert and oriented to person, place, and time.      UC Treatments / Results  Labs (all labs ordered are listed, but only abnormal results are displayed) Labs Reviewed - No data to display  EKG   Radiology DG Wrist Complete Right  Result Date: 08/10/2022 CLINICAL DATA:  Fall, swelling, pain. EXAM: RIGHT WRIST - COMPLETE 3+ VIEW COMPARISON:  None Available. FINDINGS: There is diffuse decreased bone mineralization. There is a well corticated ossicle measuring up to approximately 4 mm just distal to the ulnar styloid, likely a remote displaced tip of the ulnar styloid fracture. 1.5 mm ulnar positive variance. Mild triscaphe and thumb carpometacarpal joint space narrowing. Mild-to-moderate thumb metacarpophalangeal joint space narrowing. No acute fracture is seen. No dislocation. No acute fracture is seen. No dislocation.  IMPRESSION: 1. No acute fracture. 2. 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 Electronically Signed   By: Yvonne Kendall M.D.   On: 08/10/2022 11:18    Procedures Procedures   Medications Ordered in UC Medications - No data to display  Initial Impression / Assessment and Plan / UC Course  I have reviewed the triage vital signs and the nursing notes.  Pertinent labs & imaging results that were available during my care of the patient were reviewed by me and considered in my medical decision making (see chart for details).  Xray  per patient request is negative. Reassuring she doesn't have pain Minor swelling could be soft tissue injury Recommend ice to decrease swelling. Can follow with ortho as needed, patient reports she won't need to but gave [facility] information in case. Return precautions discussed. Patient agrees to plan  Final Clinical Impressions(s) / UC Diagnoses   Final diagnoses:  Fall, initial encounter  Swelling of right wrist     Discharge Instructions      Your xray was negative for fracture.  I recommend seeing the orthopedic specialists if you develop any symptoms (pain, stiffness, numbness, etc)    ED Prescriptions   None    PDMP not reviewed this encounter.   [place]ou, [place] 08/10/22 1417

## 2022-08-10 NOTE — Discharge Instructions (Addendum)
Your xray was negative for fracture.  I recommend seeing the orthopedic specialists if you develop any symptoms (pain, stiffness, numbness, etc)

## 2022-08-10 NOTE — ED Triage Notes (Signed)
Pt states tripped and fell yesterday on a wooden ramp. C/o bruising/swelling to top of rt hand. Denies pain.

## 2022-08-13 DIAGNOSIS — H5203 Hypermetropia, bilateral: Secondary | ICD-10-CM | POA: Diagnosis not present

## 2022-08-13 DIAGNOSIS — H401131 Primary open-angle glaucoma, bilateral, mild stage: Secondary | ICD-10-CM | POA: Diagnosis not present

## 2022-08-14 ENCOUNTER — Encounter: Payer: Self-pay | Admitting: Family Medicine

## 2022-08-14 DIAGNOSIS — E113299 Type 2 diabetes mellitus with mild nonproliferative diabetic retinopathy without macular edema, unspecified eye: Secondary | ICD-10-CM

## 2022-08-14 HISTORY — DX: Type 2 diabetes mellitus with mild nonproliferative diabetic retinopathy without macular edema, unspecified eye: E11.3299

## 2022-09-18 ENCOUNTER — Encounter: Payer: Self-pay | Admitting: Family Medicine

## 2022-09-18 LAB — HM DIABETES EYE EXAM

## 2022-09-22 NOTE — Progress Notes (Signed)
[facility] Quality Team Note  Name: Stefanie Wagner Date of Birth: 23-Sep-1951 MRN: [id number] Date: 09/22/2022  [facility] Quality Team has reviewed this patient's chart, please see recommendations below:  [facility] Quality Other; (KED: Kidney Health Evaluation Gap- Patient needs Urine Albumin Creatinine Ratio Test completed for gap closure. EGFR has already been completed, Patient has upcoming appointment with [facility] 09/24/2022).

## 2022-09-24 ENCOUNTER — Ambulatory Visit (INDEPENDENT_AMBULATORY_CARE_PROVIDER_SITE_OTHER): Payer: Medicare Other | Admitting: Family Medicine

## 2022-09-24 ENCOUNTER — Encounter: Payer: Self-pay | Admitting: Family Medicine

## 2022-09-24 VITALS — BP 156/85 | HR 98 | Ht 66.0 in | Wt 134.0 lb

## 2022-09-24 DIAGNOSIS — E785 Hyperlipidemia, unspecified: Secondary | ICD-10-CM | POA: Diagnosis not present

## 2022-09-24 DIAGNOSIS — I152 Hypertension secondary to endocrine disorders: Secondary | ICD-10-CM

## 2022-09-24 DIAGNOSIS — E1136 Type 2 diabetes mellitus with diabetic cataract: Secondary | ICD-10-CM | POA: Diagnosis not present

## 2022-09-24 DIAGNOSIS — E1159 Type 2 diabetes mellitus with other circulatory complications: Secondary | ICD-10-CM | POA: Diagnosis not present

## 2022-09-24 DIAGNOSIS — Z79899 Other long term (current) drug therapy: Secondary | ICD-10-CM | POA: Diagnosis not present

## 2022-09-24 DIAGNOSIS — E1169 Type 2 diabetes mellitus with other specified complication: Secondary | ICD-10-CM

## 2022-09-24 LAB — POCT GLYCOSYLATED HEMOGLOBIN (HGB A1C): HbA1c, POC (controlled diabetic range): 7.2 % — AB (ref 0.0–7.0)

## 2022-09-24 MED ORDER — AMLODIPINE-VALSARTAN-HCTZ 10-160-25 MG PO TABS: 1.0000 | ORAL_TABLET | Freq: Every day | ORAL | 2 refills | Status: DC

## 2022-09-24 NOTE — Patient Instructions (Signed)
Start a combination blood pressure tablet that contains three medications in one.  Two of the medications are the same or very similar to the two you have been taking.  One is new, it is valsartan>  To make sure the medication is not raising your potassium level or drying out your kidney, please come by for a blood test some time during the first week of the new year.    Dr McDiarmd would like you to come in to see me in about a month to see how well your blood pressure is and to check your diabetes A1c  Merry Christmas and a Happy New Year!

## 2022-09-25 ENCOUNTER — Encounter: Payer: Self-pay | Admitting: Family Medicine

## 2022-09-25 LAB — BASIC METABOLIC PANEL
BUN/Creatinine Ratio: 18 (ref 12–28)
BUN: 12 mg/dL (ref 8–27)
CO2: 26 mmol/L (ref 20–29)
Calcium: 10.5 mg/dL — ABNORMAL HIGH (ref 8.7–10.3)
Chloride: 94 mmol/L — ABNORMAL LOW (ref 96–106)
Creatinine, Ser: 0.68 mg/dL (ref 0.57–1.00)
Glucose: 134 mg/dL — ABNORMAL HIGH (ref 70–99)
Potassium: 3.8 mmol/L (ref 3.5–5.2)
Sodium: 137 mmol/L (ref 134–144)
eGFR: 93 mL/min/{1.73_m2} (ref >59)

## 2022-09-25 NOTE — Progress Notes (Signed)
Stefanie Wagner is alone Sources of clinical information for visit is/are patient. Nursing assessment for this office visit was reviewed with the patient for accuracy and revision.     Previous Report(s) Reviewed: none     09/24/2022   11:17 AM  Depression screen PHQ 2/9  Decreased Interest 0  Down, Depressed, Hopeless 0  PHQ - 2 Score 0  Altered sleeping 0  Tired, decreased energy 0  Change in appetite 0  Feeling bad or failure about yourself  0  Trouble concentrating 0  Moving slowly or fidgety/restless 0  Suicidal thoughts 0  PHQ-9 Score 0  Difficult doing work/chores Not difficult at all   [employer] Office Visit from 09/24/2022 in [facility] Office Visit from 04/02/2022 in [facility] Office Visit from 09/11/2021 in [facility]  Thoughts that you would be better off dead, or of hurting yourself in some way Not at all Not at all Not at all  PHQ-9 Total Score 0 0 0          09/24/2022   11:17 AM 04/02/2022    3:08 PM 09/11/2021    9:28 AM 06/19/2021    9:34 AM 08/12/2017   10:49 AM  Fall Risk   Falls in the past year? 0 0 0 0 No  Number falls in past yr: 0 0 0 0   Injury with Fall? 0 0 0 0        09/24/2022   11:17 AM 04/02/2022    3:08 PM 09/11/2021    9:27 AM  PHQ9 SCORE ONLY  PHQ-9 Total Score 0 0 0    There are no preventive care reminders to display for this patient.  Health Maintenance Due  Topic Date Due   Medicare Annual Wellness (AWV)  Never done   Zoster Vaccines- Shingrix (1 of 2) Never done   COLONOSCOPY (Pts 45-83yrs Insurance coverage will need to be confirmed)  Never done   DEXA SCAN  Never done   FOOT EXAM  09/10/2017   DTaP/Tdap/Td (2 - Tdap) 05/31/2019   MAMMOGRAM  06/08/2019   COVID-19 Vaccine (6 - 2023-24 season) 08/28/2022      History/P.E. limitations: none  There are no preventive care reminders to display for this patient.  Diabetes Health Maintenance Due   Topic Date Due   FOOT EXAM  09/10/2017   HEMOGLOBIN A1C  03/26/2023   OPHTHALMOLOGY EXAM  09/19/2023    Health Maintenance Due  Topic Date Due   Medicare Annual Wellness (AWV)  Never done   Zoster Vaccines- Shingrix (1 of 2) Never done   COLONOSCOPY (Pts 45-65yrs Insurance coverage will need to be confirmed)  Never done   DEXA SCAN  Never done   FOOT EXAM  09/10/2017   DTaP/Tdap/Td (2 - Tdap) 05/31/2019   MAMMOGRAM  06/08/2019   COVID-19 Vaccine (6 - 2023-24 season) 08/28/2022     Chief Complaint  Patient presents with   Diabetes   Hypertension     --------------------------------------------------------------------------------------------------------------------------------------------- Visit Problem List with A/P  No problem-specific Assessment & Plan notes found for this encounter.

## 2022-09-25 NOTE — Assessment & Plan Note (Signed)
Established problem. Stable. Tolerating atorvastatin 20 mg daily No signs of complications, medication side effects, or red flags. Continue current medications and other regiments.

## 2022-09-25 NOTE — Assessment & Plan Note (Addendum)
BP Readings from Last 3 Encounters:  09/24/22 (!) 156/85  08/10/22 (!) 171/89  04/02/22 (!) 154/83  Established problem Not checking at home Uncontrolled.  Patient is not at goal of <140/90. Basic Metabolic Panel:    Component Value Date/Time   NA 137 09/24/2022 1203   K 3.8 09/24/2022 1203   CL 94 (L) 09/24/2022 1203   CO2 26 09/24/2022 1203   BUN 12 09/24/2022 1203   CREATININE 0.68 09/24/2022 1203   CREATININE 0.71 06/05/2013 1623   GLUCOSE 134 (H) 09/24/2022 1203   GLUCOSE 267 (H) 06/05/2013 1623   CALCIUM 10.5 (H) 09/24/2022 1203    Start: Exforge HCT 10-160-25 Stop: Amlodipine and Indapamide RTC one month recheck along with serum creatinine and serum potassium

## 2022-09-25 NOTE — Assessment & Plan Note (Signed)
Lab Results  Component Value Date   HGBA1C 7.2 (A) 09/24/2022  Established problem Well Controlled and is at goal of A1c < 7.5%. No signs of complications, medication side effects, or red flags. Continue current medications and other regiments.

## 2022-10-08 ENCOUNTER — Other Ambulatory Visit: Payer: Medicare Other

## 2022-10-08 DIAGNOSIS — E1159 Type 2 diabetes mellitus with other circulatory complications: Secondary | ICD-10-CM | POA: Diagnosis not present

## 2022-10-08 DIAGNOSIS — Z79899 Other long term (current) drug therapy: Secondary | ICD-10-CM

## 2022-10-08 DIAGNOSIS — I152 Hypertension secondary to endocrine disorders: Secondary | ICD-10-CM | POA: Diagnosis not present

## 2022-10-09 LAB — BASIC METABOLIC PANEL
BUN/Creatinine Ratio: 26 (ref 12–28)
BUN: 18 mg/dL (ref 8–27)
CO2: 23 mmol/L (ref 20–29)
Calcium: 9.8 mg/dL (ref 8.7–10.3)
Chloride: 96 mmol/L (ref 96–106)
Creatinine, Ser: 0.69 mg/dL (ref 0.57–1.00)
Glucose: 87 mg/dL (ref 70–99)
Potassium: 4 mmol/L (ref 3.5–5.2)
Sodium: 138 mmol/L (ref 134–144)
eGFR: 93 mL/min/{1.73_m2} (ref >59)

## 2022-11-12 ENCOUNTER — Ambulatory Visit (INDEPENDENT_AMBULATORY_CARE_PROVIDER_SITE_OTHER): Payer: Medicare Other | Admitting: Family Medicine

## 2022-11-12 ENCOUNTER — Encounter: Payer: Self-pay | Admitting: Family Medicine

## 2022-11-12 VITALS — BP 131/78 | HR 100 | Ht 66.0 in | Wt 132.2 lb

## 2022-11-12 DIAGNOSIS — Z79899 Other long term (current) drug therapy: Secondary | ICD-10-CM

## 2022-11-12 DIAGNOSIS — E1159 Type 2 diabetes mellitus with other circulatory complications: Secondary | ICD-10-CM | POA: Diagnosis not present

## 2022-11-12 DIAGNOSIS — I152 Hypertension secondary to endocrine disorders: Secondary | ICD-10-CM | POA: Diagnosis not present

## 2022-11-12 MED ORDER — AMLODIPINE-VALSARTAN-HCTZ 10-160-25 MG PO TABS: 1.0000 | ORAL_TABLET | Freq: Every day | ORAL | 3 refills | Status: DC

## 2022-11-12 NOTE — Patient Instructions (Signed)
Your blood pressure looks SUPERB.   Kee taking you BP medication like you are.

## 2022-11-13 ENCOUNTER — Encounter: Payer: Self-pay | Admitting: Family Medicine

## 2022-11-13 NOTE — Progress Notes (Signed)
Stefanie Wagner is alone Sources of clinical information for visit is/are patient. Nursing assessment for this office visit was reviewed with the patient for accuracy and revision.     Previous Report(s) Reviewed: none     11/12/2022   10:18 AM  Depression screen PHQ 2/9  Decreased Interest 0  Down, Depressed, Hopeless 0  PHQ - 2 Score 0  Altered sleeping 0  Tired, decreased energy 0  Change in appetite 0  Feeling bad or failure about yourself  0  Trouble concentrating 0  Moving slowly or fidgety/restless 0  Suicidal thoughts 0  PHQ-9 Score 0   Flowsheet Row Office Visit from 11/12/2022 in [place] Office Visit from 09/24/2022 in [place] Office Visit from 04/02/2022 in [facility] [place]  Thoughts that you would be better off dead, or of hurting yourself in some way Not at all Not at all Not at all  PHQ-9 Total Score 0 0 0          11/12/2022   10:18 AM 09/24/2022   11:17 AM 04/02/2022    3:08 PM 09/11/2021    9:28 AM 06/19/2021    9:34 AM  [place] in the past year? 0 0 0 0 0  Number falls in past yr: 0 0 0 0 0  Injury with Fall? 0 0 0 0 0       11/12/2022   10:18 AM 09/24/2022   11:17 AM 04/02/2022    3:08 PM  PHQ9 SCORE ONLY  PHQ-9 Total Score 0 0 0    There are no preventive care reminders to display for this patient.  Health Maintenance Due  Topic Date Due   Medicare Annual Wellness (AWV)  Never done      History/P.E. limitations: none  There are no preventive care reminders to display for this patient.  Diabetes Health Maintenance Due  Topic Date Due   HEMOGLOBIN A1C  03/26/2023   OPHTHALMOLOGY EXAM  09/19/2023    Health Maintenance Due  Topic Date Due   Medicare Annual Wellness (AWV)  Never done     No chief complaint on file.    --------------------------------------------------------------------------------------------------------------------------------------------- Visit  Problem List with A/P  Hypertension associated with diabetes ([place]) Established problem that has improved and has meet goal of <140/90 on Amlodipine/Valsartan/HCTZ 10-160-25 capsule daily.   Continue current medication regiment. Prescription 90 tab with RFx3

## 2022-11-13 NOTE — Assessment & Plan Note (Signed)
Established problem that has improved and has meet goal of <140/90 on Amlodipine/Valsartan/HCTZ 10-160-25 capsule daily.   Continue current medication regiment. Prescription 90 tab with RFx3

## 2022-11-17 DIAGNOSIS — H401131 Primary open-angle glaucoma, bilateral, mild stage: Secondary | ICD-10-CM | POA: Diagnosis not present

## 2022-12-05 ENCOUNTER — Other Ambulatory Visit: Payer: Self-pay | Admitting: Family Medicine

## 2022-12-05 DIAGNOSIS — E1165 Type 2 diabetes mellitus with hyperglycemia: Secondary | ICD-10-CM

## 2023-01-01 ENCOUNTER — Telehealth: Payer: Self-pay | Admitting: Family Medicine

## 2023-01-01 NOTE — Telephone Encounter (Signed)
Called patient to schedule Medicare Annual Wellness Visit (AWV). Left message for patient to call back and schedule Medicare Annual Wellness Visit (AWV).  Last date of AWV: AWVI eligible as of 07/12/2017  Please schedule an AWVI appointment at any time with [place].  If any questions, please contact me at [phone].   Thank you,  [place] Direct dial  [phone]

## 2023-03-19 DIAGNOSIS — H401131 Primary open-angle glaucoma, bilateral, mild stage: Secondary | ICD-10-CM | POA: Diagnosis not present

## 2023-03-19 DIAGNOSIS — H2513 Age-related nuclear cataract, bilateral: Secondary | ICD-10-CM | POA: Diagnosis not present

## 2023-05-12 ENCOUNTER — Other Ambulatory Visit: Payer: Self-pay | Admitting: Family Medicine

## 2023-05-12 DIAGNOSIS — E1169 Type 2 diabetes mellitus with other specified complication: Secondary | ICD-10-CM

## 2023-05-27 ENCOUNTER — Other Ambulatory Visit: Payer: Self-pay | Admitting: Family Medicine

## 2023-05-27 DIAGNOSIS — E1159 Type 2 diabetes mellitus with other circulatory complications: Secondary | ICD-10-CM

## 2023-07-13 ENCOUNTER — Ambulatory Visit: Payer: Medicare Other | Admitting: *Deleted

## 2023-07-13 DIAGNOSIS — Z0000 Encounter for general adult medical examination without abnormal findings: Secondary | ICD-10-CM

## 2023-07-13 NOTE — Patient Instructions (Signed)
Stefanie Wagner , Thank you for taking time to come for your Medicare Wellness Visit. I appreciate your ongoing commitment to your health goals. Please review the following plan we discussed and let me know if I can assist you in the future.   Screening recommendations/referrals: Colonoscopy:  Mammogram:  Bone Density:  Recommended yearly ophthalmology/optometry visit for glaucoma screening and checkup Recommended yearly dental visit for hygiene and checkup  Vaccinations: Influenza vaccine: up to date Pneumococcal vaccine: up to date Tdap vaccine: Education provided Shingles vaccine:     Advanced directives: Education provided    Preventive Care 65 Years and Older, Female Preventive care refers to lifestyle choices and visits with your health care provider that can promote health and wellness. What does preventive care include? A yearly physical exam. This is also called an annual well check. Dental exams once or twice a year. Routine eye exams. Ask your health care provider how often you should have your eyes checked. Personal lifestyle choices, including: Daily care of your teeth and gums. Regular physical activity. Eating a healthy diet. Avoiding tobacco and drug use. Limiting alcohol use. Practicing safe sex. Taking low-dose aspirin every day. Taking vitamin and mineral supplements as recommended by your health care provider. What happens during an annual well check? The services and screenings done by your health care provider during your annual well check will depend on your age, overall health, lifestyle risk factors, and family history of disease. Counseling  Your health care provider may ask you questions about your: Alcohol use. Tobacco use. Drug use. Emotional well-being. Home and relationship well-being. Sexual activity. Eating habits. History of falls. Memory and ability to understand (cognition). Work and work Astronomer. Reproductive health. Screening   You may have the following tests or measurements: Height, weight, and BMI. Blood pressure. Lipid and cholesterol levels. These may be checked every 5 years, or more frequently if you are over 68 years old. Skin check. Lung cancer screening. You may have this screening every year starting at age 74 if you have a 30-pack-year history of smoking and currently smoke or have quit within the past 15 years. Fecal occult blood test (FOBT) of the stool. You may have this test every year starting at age 20. Flexible sigmoidoscopy or colonoscopy. You may have a sigmoidoscopy every 5 years or a colonoscopy every 10 years starting at age 32. Hepatitis C blood test. Hepatitis B blood test. Sexually transmitted disease (STD) testing. Diabetes screening. This is done by checking your blood sugar (glucose) after you have not eaten for a while (fasting). You may have this done every 1-3 years. Bone density scan. This is done to screen for osteoporosis. You may have this done starting at age 6. Mammogram. This may be done every 1-2 years. Talk to your health care provider about how often you should have regular mammograms. Talk with your health care provider about your test results, treatment options, and if necessary, the need for more tests. Vaccines  Your health care provider may recommend certain vaccines, such as: Influenza vaccine. This is recommended every year. Tetanus, diphtheria, and acellular pertussis (Tdap, Td) vaccine. You may need a Td booster every 10 years. Zoster vaccine. You may need this after age 63. Pneumococcal 13-valent conjugate (PCV13) vaccine. One dose is recommended after age 22. Pneumococcal polysaccharide (PPSV23) vaccine. One dose is recommended after age 54. Talk to your health care provider about which screenings and vaccines you need and how often you need them. This information is not intended  to replace advice given to you by your health care provider. Make sure you discuss  any questions you have with your health care provider. Document Released: 10/25/2015 Document Revised: 06/17/2016 Document Reviewed: 07/30/2015 Elsevier Interactive Patient Education  2017 [employer].  Fall Prevention in the Home Falls can cause injuries. They can happen to people of all ages. There are many things you can do to make your home safe and to help prevent falls. What can I do on the outside of my home? Regularly fix the edges of walkways and driveways and fix any cracks. Remove anything that might make you trip as you walk through a door, such as a raised step or threshold. Trim any bushes or trees on the path to your home. Use bright outdoor lighting. Clear any walking paths of anything that might make someone trip, such as rocks or tools. Regularly check to see if handrails are loose or broken. Make sure that both sides of any steps have handrails. Any raised decks and porches should have guardrails on the edges. Have any leaves, snow, or ice cleared regularly. Use sand or salt on walking paths during winter. Clean up any spills in your garage right away. This includes oil or grease spills. What can I do in the bathroom? Use night lights. Install grab bars by the toilet and in the tub and shower. Do not use towel bars as grab bars. Use non-skid mats or decals in the tub or shower. If you need to sit down in the shower, use a plastic, non-slip stool. Keep the floor dry. Clean up any water that spills on the floor as soon as it happens. Remove soap buildup in the tub or shower regularly. Attach bath mats securely with double-sided non-slip rug tape. Do not have throw rugs and other things on the floor that can make you trip. What can I do in the bedroom? Use night lights. Make sure that you have a light by your bed that is easy to reach. Do not use any sheets or blankets that are too big for your bed. They should not hang down onto the floor. Have a firm chair that has  side arms. You can use this for support while you get dressed. Do not have throw rugs and other things on the floor that can make you trip. What can I do in the kitchen? Clean up any spills right away. Avoid walking on wet floors. Keep items that you use a lot in easy-to-reach places. If you need to reach something above you, use a strong step stool that has a grab bar. Keep electrical cords out of the way. Do not use floor polish or wax that makes floors slippery. If you must use wax, use non-skid floor wax. Do not have throw rugs and other things on the floor that can make you trip. What can I do with my stairs? Do not leave any items on the stairs. Make sure that there are handrails on both sides of the stairs and use them. Fix handrails that are broken or loose. Make sure that handrails are as long as the stairways. Check any carpeting to make sure that it is firmly attached to the stairs. Fix any carpet that is loose or worn. Avoid having throw rugs at the top or bottom of the stairs. If you do have throw rugs, attach them to the floor with carpet tape. Make sure that you have a light switch at the top of the stairs and  the bottom of the stairs. If you do not have them, ask someone to add them for you. What else can I do to help prevent falls? Wear shoes that: Do not have high heels. Have rubber bottoms. Are comfortable and fit you well. Are closed at the toe. Do not wear sandals. If you use a stepladder: Make sure that it is fully opened. Do not climb a closed stepladder. Make sure that both sides of the stepladder are locked into place. Ask someone to hold it for you, if possible. Clearly mark and make sure that you can see: Any grab bars or handrails. First and last steps. Where the edge of each step is. Use tools that help you move around (mobility aids) if they are needed. These include: Canes. Walkers. Scooters. Crutches. Turn on the lights when you go into a dark area.  Replace any light bulbs as soon as they burn out. Set up your furniture so you have a clear path. Avoid moving your furniture around. If any of your floors are uneven, fix them. If there are any pets around you, be aware of where they are. Review your medicines with your doctor. Some medicines can make you feel dizzy. This can increase your chance of falling. Ask your doctor what other things that you can do to help prevent falls. This information is not intended to replace advice given to you by your health care provider. Make sure you discuss any questions you have with your health care provider. Document Released: 07/25/2009 Document Revised: 03/05/2016 Document Reviewed: 11/02/2014 Elsevier Interactive Patient Education  2017 [employer].

## 2023-07-13 NOTE — Progress Notes (Signed)
Subjective:   Stefanie Wagner is a 72 y.o. female who presents for Medicare Annual (Subsequent) preventive examination.  Visit Complete: Virtual  I connected with  Stefanie Wagner on 07/13/23 by a audio enabled telemedicine application and verified that I am speaking with the correct person using two identifiers.  Patient Location: Home  Provider Location: Home Office  I discussed the limitations of evaluation and management by telemedicine. The patient expressed understanding and agreed to proceed.  Because this visit was a virtual/telehealth visit, some criteria may be missing or patient reported. Any vitals not documented were not able to be obtained and vitals that have been documented are patient reported.    Cardiac Risk Factors include: advanced age (>66men, >15 women);diabetes mellitus;hypertension;family history of premature cardiovascular disease     Objective:    There were no vitals filed for this visit. There is no height or weight on file to calculate BMI.     07/13/2023   10:17 AM 11/12/2022   10:17 AM 09/24/2022   11:17 AM 04/02/2022    3:09 PM 09/11/2021    9:28 AM 06/19/2021    9:34 AM 08/12/2017   10:49 AM  Advanced Directives  Does Patient Have a Medical Advance Directive? Yes No No No No No No  Type of Media planner of Healthcare Power of Attorney in Chart? No - copy requested        Would patient like information on creating a medical advance directive?  No - Patient declined No - Patient declined  No - Patient declined No - Patient declined No - Patient declined    Current Medications (verified) Outpatient Encounter Medications as of 07/13/2023  Medication Sig   atorvastatin (LIPITOR) 20 MG tablet TAKE 1 TABLET BY MOUTH EVERY DAY   Biotin 5000 MCG TABS Take 5,000 mcg by mouth daily.   latanoprost (XALATAN) 0.005 % ophthalmic solution Place 1 drop into both eyes at bedtime.   metFORMIN (GLUCOPHAGE-XR) 500 MG 24 hr  tablet TAKE 3 TABLETS (1,500 MG TOTAL) BY MOUTH DAILY WITH BREAKFAST   Multiple Vitamins-Minerals (CENTRUM SILVER ULTRA WOMENS PO) Take 1 tablet by mouth daily.   [DISCONTINUED] amLODIPine-Valsartan-HCTZ 10-160-25 MG TABS Take 1 tablet by mouth daily.   No facility-administered encounter medications on file as of 07/13/2023.    Allergies (verified) Neomycin, Neosporin [neomycin-bacitracin zn-polymyx], and Zostavax [zoster vaccine live]   History: Past Medical History:  Diagnosis Date   Cataract 04/26/2014   Diabetes mellitus without complication (HCC)    Diabetes mellitus, controlled (HCC) 06/15/2013   DIABETES GOALS:  Glucose Control: Target HgA1C: < 7  ACE/ARB (if BP >140/80 or proteinuria): No STATIN: Yes LAST EYE EXAM (Annually):  FOOT EXAM (Annually): LDL (less than 100 or 70): Yes MICROALB (last yr):  BMI >25 (nutrition counseling):  Exercise (112min/week):  Yes If smoker, encourage Tobacco Cessation: No Vaccinations: Pneumovac (1x<65, repeat x1 after 5 yr at 32), FLU (yearly), Td:     Essential hypertension, benign 05/30/2009   Qualifier: Diagnosis of  By: McDiarmid MD, Sharol Given 04/26/2014   H/O abnormal mammogram 06/01/2013   05/19/13: abnormal screening mammogram. R breast. 06/26/13: diagnostic mammogram > calcification. Recommend Stereotactic-guided biopsy to r/o DCIS.  06/30/2013: Stereotactic-guided biopsy of right breast > fibrocystic changes and calcification. Recommendation screening mammogram in one year.      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 No pertinent family history. Social History   Socioeconomic History   Marital status: Married    Spouse name: Not on file   Number of  children: Not on file   Years of education: Not on file   Highest education level: Not on file  Occupational History   Occupation: Homemaker  Tobacco Use   Smoking status: Never   Smokeless tobacco: Never  Substance and Sexual Activity   Alcohol use: No   Drug use: No   Sexual activity: Yes    Partners: Male  Other Topics Concern   Not on file  Social History Narrative   Alcohol-Tobacco-Diet       Alcohol drinks/day: 0       Tobacco Status: never       Exercise-Depression-Behavior       Does Patient Exercise: no       Exercise Counseling: to improve exercise regimen       Have you felt down or hopeless? no       STD Risk: never       Drug Use: never       Seat Belt Use: always       Sun Exposure: infrequent        Past Medical History:   `White-coat` hypertension, Postmenopausal state., Taking no medications(07/07)       Family History:   No FH Cancer, DM       Social History:   Married, Does not work outside of home.      Two adopted children-   dgt with Rett syndrome requiring 24 hour day care, and son with Pervasive Development Disorder (Aspergers - like condition).      Does not smoke.      Does not drink alcohol.      No illicit drug use.   Religion: Methodist   Smoking Status:  never   Does Patient Exercise:  no   STD Risk:  never   Drug Use:  never   Seat Belt Use:  always   Sun Exposure-Excessive:  infrequent            Social Determinants of Health   Financial Resource Strain: Low Risk  (07/13/2023)   Overall Financial Resource Strain (CARDIA)    Difficulty of Paying Living Expenses: Not hard at all  Food Insecurity: No Food Insecurity (07/13/2023)   Hunger Vital Sign    Worried About Running Out of Food in the Last Year: Never true    Ran Out of Food in the Last Year: Never true  Transportation Needs: No Transportation Needs (07/13/2023)   PRAPARE - Administrator, Civil Service (Medical): No    Lack of Transportation (Non-Medical):  No  Physical Activity: Sufficiently Active (07/13/2023)   Exercise Vital Sign    Days of Exercise per Week: 5 days    Minutes of Exercise per Session: 40 min  Stress: No Stress Concern Present (07/13/2023)   [employer] of Occupational Health - Occupational Stress Questionnaire    Feeling of Stress : Not at all  Social Connections: Socially Integrated (07/13/2023)   Social Connection and Isolation Panel [NHANES]    Frequency of Communication with Friends and Family: More than three times a week    Frequency of Social Gatherings  with Friends and Family: Three times a week    Attends Religious Services: More than 4 times per year    Active Member of Clubs or Organizations: Yes    Attends Engineer, structural: Not on file    Marital Status: Married    Tobacco Counseling Counseling given: Not Answered   Clinical Intake:  Pre-visit preparation completed: Yes  Pain : No/denies pain     Diabetes: Yes CBG done?: No Did pt. bring in CBG monitor from home?: No  How often do you need to have someone help you when you read instructions, pamphlets, or other written materials from your doctor or pharmacy?: 1 - Never  Interpreter Needed?: No  Information entered by :: Remi Haggard LPN   Activities of Daily Living    07/13/2023   10:18 AM  In your present state of health, do you have any difficulty performing the following activities:  Hearing? 0  Vision? 0  Difficulty concentrating or making decisions? 0  Walking or climbing stairs? 0  Dressing or bathing? 0  Doing errands, shopping? 0  Preparing Food and eating ? N  Using the Toilet? N  In the past six months, have you accidently leaked urine? N  Do you have problems with loss of bowel control? N  Managing your Medications? N  Managing your Finances? N    Patient Care Team: McDiarmid, Leighton Roach, MD as PCP - General Sinda Du, MD as Consulting Physician (Ophthalmology)  Indicate any recent Medical Services  you may have received from other than [facility] providers in the past year (date may be approximate).     Assessment:   This is a routine wellness examination for Ivanell.  Hearing/Vision screen Hearing Screening - Comments:: No trouble hearing Vision Screening - Comments:: [facility] Up to date   Goals Addressed             This Visit's Progress    Increase physical activity         Depression Screen    07/13/2023   10:20 AM 11/12/2022   10:18 AM 09/24/2022   11:17 AM 04/02/2022    3:08 PM 09/11/2021    9:27 AM 06/19/2021    9:34 AM 08/12/2017   10:49 AM  PHQ 2/9 Scores  PHQ - 2 Score 0 0 0 0 0 2 0  PHQ- 9 Score 0 0 0 0 0 3     Fall Risk    07/13/2023   10:17 AM 11/12/2022   10:18 AM 09/24/2022   11:17 AM 04/02/2022    3:08 PM 09/11/2021    9:28 AM  Fall Risk   Falls in the past year? 0 0 0 0 0  Number falls in past yr: 0 0 0 0 0  Injury with Fall? 0 0 0 0 0  Follow up Falls evaluation completed;Education provided;Falls prevention discussed        MEDICARE RISK AT HOME: Medicare Risk at Home Any stairs in or around the home?: Yes If so, are there any without handrails?: No Home free of loose throw rugs in walkways, pet beds, electrical cords, etc?: Yes Adequate lighting in your home to reduce risk of falls?: Yes Life alert?: No Use of a cane, walker or w/c?: No Grab bars in the bathroom?: Yes Shower chair or bench in shower?: Yes Elevated toilet seat or a handicapped toilet?: Yes  TIMED UP AND GO:  Was the test performed?  No    Cognitive Function:  07/13/2023   10:21 AM  6CIT Screen  What Year? 0 points  What month? 0 points  What time? 0 points  Count back from 20 0 points  Months in reverse 0 points  Repeat phrase 0 points  Total Score 0 points    Immunizations Immunization History  Administered Date(s) Administered   Fluad Quad(high Dose 65+) 06/14/2020, 06/19/2021, 06/13/2022   Influenza Split 07/17/2011, 07/21/2012   Influenza Whole  07/27/2007, 07/03/2008, 07/17/2009, 07/18/2010   Influenza, High Dose Seasonal PF 06/12/2023   Influenza,inj,Quad PF,6+ Mos 06/15/2013, 06/19/2014, 07/24/2015, 07/08/2016, 06/10/2017, 07/01/2018, 06/26/2019   PFIZER Comirnaty(Gray Top)Covid-19 Tri-Sucrose Vaccine 07/03/2022   PFIZER(Purple Top)SARS-COV-2 Vaccination 11/16/2019, 12/11/2019, 07/19/2020, 06/21/2021   PNEUMOCOCCAL CONJUGATE-20 04/02/2022   Pfizer(Comirnaty)Fall Seasonal Vaccine 12 years and older 06/19/2023   Pneumococcal Conjugate-13 09/10/2016   Pneumococcal Polysaccharide-23 05/23/2014   Respiratory Syncytial Virus Vaccine,Recomb Aduvanted(Arexvy) 11/19/2022   Td 05/30/2009    TDAP status: Due, Education has been provided regarding the importance of this vaccine. Advised may receive this vaccine at local pharmacy or Health Dept. Aware to provide a copy of the vaccination record if obtained from local pharmacy or Health Dept. Verbalized acceptance and understanding.  Flu Vaccine status: Up to date  Pneumococcal vaccine status: Up to date  Covid-19 vaccine status: Completed vaccines  Qualifies for Shingles Vaccine? No   Zostavax completed patient is Allergic  Shingrix Completed?: No.    Education has been provided regarding the importance of this vaccine. Patient has been advised to call insurance company to determine out of pocket expense if they have not yet received this vaccine. Advised may also receive vaccine at local pharmacy or Health Dept. Verbalized acceptance and understanding.  Screening Tests Health Maintenance  Topic Date Due   Zoster Vaccines- Shingrix (1 of 2) Never done   HEMOGLOBIN A1C  03/26/2023   Diabetic kidney evaluation - Urine ACR  04/03/2023   MAMMOGRAM  09/26/2023 (Originally 06/08/2019)   DEXA SCAN  09/26/2023 (Originally 07/30/2016)   Colonoscopy  09/26/2023 (Originally 07/30/1996)   COVID-19 Vaccine (7 - 2023-24 season) 08/14/2023   OPHTHALMOLOGY EXAM  09/19/2023   Diabetic kidney  evaluation - eGFR measurement  10/09/2023   Medicare Annual Wellness (AWV)  07/12/2024   Pneumonia Vaccine 46+ Years old  Completed   INFLUENZA VACCINE  Completed   Hepatitis C Screening  Completed   HPV VACCINES  Aged Out   DTaP/Tdap/Td  Discontinued    Health Maintenance  Health Maintenance Due  Topic Date Due   Zoster Vaccines- Shingrix (1 of 2) Never done   HEMOGLOBIN A1C  03/26/2023   Diabetic kidney evaluation - Urine ACR  04/03/2023    Colonoscopy   previously declined Education provided  Mammogram  previously declined Education provided  Bone Density previously declined  Education provided    Lung Cancer Screening: (Low Dose CT Chest recommended if Age 79-80 years, 20 pack-year currently smoking OR have quit w/in 15years.) does not qualify.   Lung Cancer Screening Referral:   Additional Screening:  Hepatitis C Screening: does not qualify; Completed 2015  Vision Screening: Recommended annual ophthalmology exams for early detection of glaucoma and other disorders of the eye. Is the patient up to date with their annual eye exam?  Yes  Who is the provider or what is the name of the office in which the patient attends annual eye exams? [facility] If pt is not established with a provider, would they like to be referred to a provider to establish care? No .  Dental Screening: Recommended annual dental exams for proper oral hygiene  Nutrition Risk Assessment:  Has the patient had any N/V/D within the last 2 months?  No  Does the patient have any non-healing wounds?  No  Has the patient had any unintentional weight loss or weight gain?  No   Diabetes:  Is the patient diabetic?  Yes  If diabetic, was a CBG obtained today?  No  Did the patient bring in their glucometer from home?  No  How often do you monitor your CBG's? Does not check.   Financial Strains and Diabetes Management:  Are you having any financial strains with the device, your supplies or your medication?  No .  Does the patient want to be seen by Chronic Care Management for management of their diabetes?  No  Would the patient like to be referred to a Nutritionist or for Diabetic Management?  No   Diabetic Exams:  Diabetic Eye Exam: Completed . Overdue for diabetic eye exam. Pt has been advised about the importance in completing this exam.   Diabetic Foot Exam Pt has been advised about the importance in completing this exam.     Community Resource Referral / Chronic Care Management: CRR required this visit?  No   CCM required this visit?  No     Plan:     I have personally reviewed and noted the following in the patient's chart:   Medical and social history Use of alcohol, tobacco or illicit drugs  Current medications and supplements including opioid prescriptions. Patient is not currently taking opioid prescriptions. Functional ability and status Nutritional status Physical activity Advanced directives List of other physicians Hospitalizations, surgeries, and ER visits in previous 12 months Vitals Screenings to include cognitive, depression, and falls Referrals and appointments  In addition, I have reviewed and discussed with patient certain preventive protocols, quality metrics, and best practice recommendations. A written personalized care plan for preventive services as well as general preventive health recommendations were provided to patient.     Remi Haggard, LPN   16/10/958   After Visit Summary: (MyChart) Due to this being a telephonic visit, the after visit summary with patients personalized plan was offered to patient via MyChart   Nurse Notes:

## 2023-07-15 DIAGNOSIS — H401131 Primary open-angle glaucoma, bilateral, mild stage: Secondary | ICD-10-CM | POA: Diagnosis not present

## 2023-07-19 ENCOUNTER — Encounter: Payer: Self-pay | Admitting: *Deleted

## 2023-10-19 ENCOUNTER — Other Ambulatory Visit: Payer: Self-pay | Admitting: Family Medicine

## 2023-10-20 NOTE — Telephone Encounter (Signed)
 Attempted to call patient to schedule appointment.   Patient did not answer, LVM asking that she return call to office to schedule PCP follow up.   Veronda Prude, RN

## 2023-10-20 NOTE — Telephone Encounter (Signed)
[facility] before further refills  

## 2023-12-15 ENCOUNTER — Other Ambulatory Visit: Payer: Self-pay | Admitting: Family Medicine

## 2023-12-15 DIAGNOSIS — E1165 Type 2 diabetes mellitus with hyperglycemia: Secondary | ICD-10-CM

## 2023-12-16 NOTE — Telephone Encounter (Signed)
 Spoke with patient to make appt with PCP sometime soon for future med refills. Patient stated to me that her husband has just had surgery and his white cells count has been low. So she has been staying away from crowds of people.She would like to make her appt after flu season is over just to make sure she doesn't make her husband sick. She also asked if so to give her a little time in refilling her Rx. And if the provider has any question to please give her a call. Aquilla Solian, CMA

## 2023-12-31 ENCOUNTER — Other Ambulatory Visit: Payer: Self-pay | Admitting: Family Medicine

## 2023-12-31 DIAGNOSIS — E1165 Type 2 diabetes mellitus with hyperglycemia: Secondary | ICD-10-CM

## 2024-01-03 ENCOUNTER — Other Ambulatory Visit: Payer: Self-pay | Admitting: Family Medicine

## 2024-01-03 DIAGNOSIS — E1165 Type 2 diabetes mellitus with hyperglycemia: Secondary | ICD-10-CM

## 2024-01-03 NOTE — Telephone Encounter (Signed)
[facility] before further refills  

## 2024-01-04 NOTE — Telephone Encounter (Signed)
 Spoke with patient. She stated that she will be running out of her Metformin in April. Wanted to know if Dr. McDiarmid Received the message that she would like a 90 day supply of that medication? She also stated that she is not trying to avoid him. She has a lot going on with taking care of her husband and parents. We made an appt for May 22nd at 9:30. Aquilla Solian, CMA

## 2024-01-16 ENCOUNTER — Other Ambulatory Visit: Payer: Self-pay | Admitting: Family Medicine

## 2024-01-17 DIAGNOSIS — H2513 Age-related nuclear cataract, bilateral: Secondary | ICD-10-CM | POA: Diagnosis not present

## 2024-01-17 DIAGNOSIS — H401131 Primary open-angle glaucoma, bilateral, mild stage: Secondary | ICD-10-CM | POA: Diagnosis not present

## 2024-03-02 ENCOUNTER — Encounter: Payer: Self-pay | Admitting: Family Medicine

## 2024-03-02 ENCOUNTER — Other Ambulatory Visit: Payer: Self-pay | Admitting: Family Medicine

## 2024-03-02 ENCOUNTER — Ambulatory Visit (INDEPENDENT_AMBULATORY_CARE_PROVIDER_SITE_OTHER): Admitting: Family Medicine

## 2024-03-02 VITALS — BP 130/64 | HR 97 | Ht 66.0 in | Wt 136.4 lb

## 2024-03-02 DIAGNOSIS — R011 Cardiac murmur, unspecified: Secondary | ICD-10-CM | POA: Diagnosis not present

## 2024-03-02 DIAGNOSIS — Z79899 Other long term (current) drug therapy: Secondary | ICD-10-CM | POA: Diagnosis not present

## 2024-03-02 DIAGNOSIS — I152 Hypertension secondary to endocrine disorders: Secondary | ICD-10-CM | POA: Diagnosis not present

## 2024-03-02 DIAGNOSIS — E1169 Type 2 diabetes mellitus with other specified complication: Secondary | ICD-10-CM | POA: Diagnosis not present

## 2024-03-02 DIAGNOSIS — E1159 Type 2 diabetes mellitus with other circulatory complications: Secondary | ICD-10-CM

## 2024-03-02 DIAGNOSIS — Z0000 Encounter for general adult medical examination without abnormal findings: Secondary | ICD-10-CM

## 2024-03-02 DIAGNOSIS — E1136 Type 2 diabetes mellitus with diabetic cataract: Secondary | ICD-10-CM | POA: Diagnosis not present

## 2024-03-02 DIAGNOSIS — E785 Hyperlipidemia, unspecified: Secondary | ICD-10-CM | POA: Diagnosis not present

## 2024-03-02 DIAGNOSIS — X32XXXA Exposure to sunlight, initial encounter: Secondary | ICD-10-CM | POA: Insufficient documentation

## 2024-03-02 DIAGNOSIS — X32XXXD Exposure to sunlight, subsequent encounter: Secondary | ICD-10-CM

## 2024-03-02 LAB — POCT GLYCOSYLATED HEMOGLOBIN (HGB A1C): HbA1c, POC (controlled diabetic range): 7.5 % — AB (ref 0.0–7.0)

## 2024-03-02 NOTE — Patient Instructions (Signed)
 Your blood pressure and blood sugars are under good control.  Your A1c is up slightly from last time, 7.1 to 7.5%, but still under good control.  Keep taking your metformin  like your are.   We are checking your kidney, and electrolytes, cholesterol and vitamin b12 today.  You are due for mammogram, colonoscopy, bone density testing, and Shingles vaccinations.  If you decide to have these screening test performed, please let Dr Alf Doyle know.

## 2024-03-03 ENCOUNTER — Encounter: Payer: Self-pay | Admitting: Family Medicine

## 2024-03-03 ENCOUNTER — Ambulatory Visit: Payer: Self-pay | Admitting: Family Medicine

## 2024-03-03 DIAGNOSIS — Z0000 Encounter for general adult medical examination without abnormal findings: Secondary | ICD-10-CM | POA: Insufficient documentation

## 2024-03-03 DIAGNOSIS — R011 Cardiac murmur, unspecified: Secondary | ICD-10-CM | POA: Insufficient documentation

## 2024-03-03 LAB — RENAL FUNCTION PANEL
Albumin: 4.9 g/dL — ABNORMAL HIGH (ref 3.8–4.8)
BUN/Creatinine Ratio: 18 (ref 12–28)
BUN: 13 mg/dL (ref 8–27)
CO2: 23 mmol/L (ref 20–29)
Calcium: 10.2 mg/dL (ref 8.7–10.3)
Chloride: 94 mmol/L — ABNORMAL LOW (ref 96–106)
Creatinine, Ser: 0.73 mg/dL (ref 0.57–1.00)
Glucose: 185 mg/dL — ABNORMAL HIGH (ref 70–99)
Phosphorus: 3.9 mg/dL (ref 3.0–4.3)
Potassium: 4.5 mmol/L (ref 3.5–5.2)
Sodium: 138 mmol/L (ref 134–144)
eGFR: 87 mL/min/{1.73_m2} (ref >59)

## 2024-03-03 LAB — MICROALBUMIN / CREATININE URINE RATIO
Creatinine, Urine: 32.8 mg/dL
Microalb/Creat Ratio: <9 mg/g{creat} (ref 0–29)
Microalbumin, Urine: <3 ug/mL

## 2024-03-03 LAB — LDL CHOLESTEROL, DIRECT: LDL Direct: 98 mg/dL (ref 0–99)

## 2024-03-03 LAB — VITAMIN B12: Vitamin B-12: 940 pg/mL (ref 232–1245)

## 2024-03-03 NOTE — Assessment & Plan Note (Addendum)
 Reviewed what screening tests were due with patient - MM,csopy, Dexa, along with vaccination of Shingrix  Ms Stefanie Wagner declined all these interventions.

## 2024-03-03 NOTE — Assessment & Plan Note (Addendum)
 Established problem Well Controlled. Patient is at goal of SBP < 140. No signs of complications, medication side effects, or red flags. Lab Results  Component Value Date   NA 138 03/02/2024   K 4.5 03/02/2024   CO2 23 03/02/2024   GLUCOSE 185 (H) 03/02/2024   BUN 13 03/02/2024   CREATININE 0.73 03/02/2024   CALCIUM  10.2 03/02/2024   EGFR 87 03/02/2024  Stefanie Wagner will bring in a home collected urine for UACR Continue current medications and other regiments.

## 2024-03-03 NOTE — Assessment & Plan Note (Signed)
 Established problem Lab Results  Component Value Date   HGBA1C 7.5 (A) 03/02/2024    Well Controlled. Patient is at goal of A1c < 8.0%. No signs of complications, medication side effects, or red flags. Continue current medications and other regiments.

## 2024-03-03 NOTE — Progress Notes (Signed)
 Stefanie Wagner is alone Sources of clinical information for visit is/are patient. Nursing assessment for this office visit was reviewed with the patient for accuracy and revision.     Previous Report(s) Reviewed: none     03/02/2024    9:47 AM  Depression screen PHQ 2/9  Decreased Interest 0  Down, Depressed, Hopeless 0  PHQ - 2 Score 0  Altered sleeping 0  Tired, decreased energy 0  Change in appetite 0  Feeling bad or failure about yourself  0  Trouble concentrating 0  Moving slowly or fidgety/restless 0  Suicidal thoughts 0  PHQ-9 Score 0   Flowsheet Row Office Visit from 03/02/2024 in [facility] - [facility] Dept Of [place]. [facility] Clinical Support from 07/13/2023 in [facility] - [facility] Dept Of [place]. [facility] Office Visit from 11/12/2022 in [facility] - [facility] Dept Of [facility]. [facility]  Thoughts that you would be better off dead, or of hurting yourself in some way Not at all Not at all Not at all  PHQ-9 Total Score 0 0 0          03/02/2024    9:47 AM 07/13/2023   10:17 AM 11/12/2022   10:18 AM 09/24/2022   11:17 AM 04/02/2022    3:08 PM  Fall Risk   Falls in the past year? 0 0 0 0 0  Number falls in past yr: 0 0 0 0 0  Injury with Fall? 0 0 0 0 0  Follow up  Falls evaluation completed;Education provided;Falls prevention discussed          03/02/2024    9:47 AM 07/13/2023   10:20 AM 11/12/2022   10:18 AM  PHQ9 SCORE ONLY  PHQ-9 Total Score 0 0 0    There are no preventive care reminders to display for this patient.  Health Maintenance Due  Topic Date Due   Zoster Vaccines- Shingrix (1 of 2) Never done   Colonoscopy  Never done   DEXA SCAN  Never done   MAMMOGRAM  06/08/2019   Diabetic kidney evaluation - Urine ACR  04/03/2023   OPHTHALMOLOGY EXAM  09/19/2023      History/P.E. limitations: none  There are no preventive care reminders to display for this patient.  Diabetes Health  Maintenance Due  Topic Date Due   OPHTHALMOLOGY EXAM  09/19/2023   HEMOGLOBIN A1C  09/02/2024    Health Maintenance Due  Topic Date Due   Zoster Vaccines- Shingrix (1 of 2) Never done   Colonoscopy  Never done   DEXA SCAN  Never done   MAMMOGRAM  06/08/2019   Diabetic kidney evaluation - Urine ACR  04/03/2023   OPHTHALMOLOGY EXAM  09/19/2023     Chief Complaint  Patient presents with   Medication Refill     --------------------------------------------------------------------------------------------------------------------------------------------- Visit Problem List with A/P  No problem-specific Assessment & Plan notes found for this encounter.

## 2024-03-03 NOTE — Assessment & Plan Note (Addendum)
 New finding No chest pain, no shortness of breath, no DOE, no dizziness/near-syncope/syncope Cor: RRR, 2/6 systolic murmur best in to right of upper sternum.  Not holosystolic, No radiation into carotids.          Brisk carotid artery upstrokes.  Discussed possible causes, including AS.  We discussed the symptoms of AS which she denied.  After discussion, Stefanie Wagner elected to not pursue EKG nor transthoracic echocardiogram evaluation.   We discussed that she should go immediately to the ED should any of the symptoms develop.

## 2024-03-03 NOTE — Assessment & Plan Note (Signed)
 Established problem Well Controlled. Patient is at goal of LDL < 100. No signs of complications, medication side effects, or red flags. Continue current medications and other regiments.

## 2024-04-05 ENCOUNTER — Other Ambulatory Visit: Payer: Self-pay | Admitting: Family Medicine

## 2024-04-05 DIAGNOSIS — E1169 Type 2 diabetes mellitus with other specified complication: Secondary | ICD-10-CM

## 2024-04-05 DIAGNOSIS — E1165 Type 2 diabetes mellitus with hyperglycemia: Secondary | ICD-10-CM

## 2024-07-06 ENCOUNTER — Ambulatory Visit (INDEPENDENT_AMBULATORY_CARE_PROVIDER_SITE_OTHER)

## 2024-07-06 DIAGNOSIS — Z23 Encounter for immunization: Secondary | ICD-10-CM

## 2024-07-06 NOTE — Progress Notes (Signed)
 Patient presents to nurse clinic for COVID vaccine.   Administered in LD with no complication.   [name], RN

## 2024-07-24 DIAGNOSIS — H2513 Age-related nuclear cataract, bilateral: Secondary | ICD-10-CM | POA: Diagnosis not present

## 2024-07-24 DIAGNOSIS — H401131 Primary open-angle glaucoma, bilateral, mild stage: Secondary | ICD-10-CM | POA: Diagnosis not present

## 2024-07-24 DIAGNOSIS — H5203 Hypermetropia, bilateral: Secondary | ICD-10-CM | POA: Diagnosis not present

## 2024-08-24 ENCOUNTER — Ambulatory Visit

## 2024-08-24 VITALS — Ht 66.0 in | Wt 134.0 lb

## 2024-08-24 DIAGNOSIS — Z0000 Encounter for general adult medical examination without abnormal findings: Secondary | ICD-10-CM | POA: Diagnosis not present

## 2024-08-24 NOTE — Patient Instructions (Signed)
 Ms. Pearman,  Thank you for taking the time for your Medicare Wellness Visit. I appreciate your continued commitment to your health goals. Please review the care plan we discussed, and feel free to reach out if I can assist you further.  Please note that Annual Wellness Visits do not include a physical exam. Some assessments may be limited, especially if the visit was conducted virtually. If needed, we may recommend an in-person follow-up with your provider.  Ongoing Care Seeing your primary care provider every 3 to 6 months helps us  monitor your health and provide consistent, personalized care.   Referrals If a referral was made during today's visit and you haven't received any updates within two weeks, please contact the referred provider directly to check on the status.  Recommended Screenings:  Health Maintenance  Topic Date Due   Zoster (Shingles) Vaccine (1 of 2) Never done   Colon Cancer Screening  Never done   DEXA scan (bone density measurement)  Never done   Breast Cancer Screening  06/08/2019   Medicare Annual Wellness Visit  07/12/2024   Eye exam for diabetics  07/14/2024   Hemoglobin A1C  09/02/2024   COVID-19 Vaccine (7 - Pfizer risk 2025-26 season) 01/03/2025   Yearly kidney function blood test for diabetes  03/02/2025   Yearly kidney health urinalysis for diabetes  03/02/2025   Pneumococcal Vaccine for age over 16  Completed   Flu Shot  Completed   Hepatitis C Screening  Completed   Meningitis B Vaccine  Aged Out   DTaP/Tdap/Td vaccine  Discontinued       08/24/2024    2:17 PM  Advanced Directives  Does Patient Have a Medical Advance Directive? Yes  Type of Estate Agent of [place];Living will  Does patient want to make changes to medical advance directive? No - Patient declined  Copy of Healthcare Power of Attorney in Chart? No - copy requested    Vision: Annual vision screenings are recommended for early detection of glaucoma,  cataracts, and diabetic retinopathy. These exams can also reveal signs of chronic conditions such as diabetes and high blood pressure.  Dental: Annual dental screenings help detect early signs of oral cancer, gum disease, and other conditions linked to overall health, including heart disease and diabetes.  Please see the attached documents for additional preventive care recommendations.

## 2024-08-24 NOTE — Progress Notes (Signed)
 I connected with  Stefanie Wagner Patient on 08/24/24 by a audio enabled telemedicine application and verified that I am speaking with the correct person using two identifiers.  Patient Location: Home  Provider Location: Home Office  Persons Participating in Visit: Patient.  I discussed the limitations of evaluation and management by telemedicine. The patient expressed understanding and agreed to proceed.   Vital Signs: Because this visit was a virtual/telehealth visit, some criteria may be missing or patient reported. Any vitals not documented were not able to be obtained and vitals that have been documented are patient reported.   Chief Complaint  Patient presents with   Medicare Wellness    SUBSEQUENT     Subjective:   DOTTI Wagner is a 73 y.o. female who presents for a Medicare Annual Wellness Visit.  Allergies (verified) Neomycin, Neosporin [neomycin-bacitracin zn-polymyx], and Zostavax [zoster vaccine live]   History: Past Medical History:  Diagnosis Date   Cataract 04/26/2014   Diabetes mellitus, controlled (HCC) 06/15/2013   DIABETES GOALS:  Glucose Control: Target HgA1C: < 7  ACE/ARB (if BP >140/80 or proteinuria): No STATIN: Yes LAST EYE EXAM (Annually):  FOOT EXAM (Annually): LDL (less than 100 or 70): Yes MICROALB (last yr):  BMI >25 (nutrition counseling):  Exercise (142min/week):  Yes If smoker, encourage Tobacco Cessation: No Vaccinations: Pneumovac (1x<65, repeat x1 after 5 yr at 14), FLU (yearly), Td:     Essential hypertension, benign 05/30/2009   Qualifier: Diagnosis of  By: McDiarmid MD, Krystal Peels 04/26/2014   H/O abnormal mammogram 06/01/2013   05/19/13: abnormal screening mammogram. R breast. 06/26/13: diagnostic mammogram > calcification. Recommend Stereotactic-guided biopsy to r/o DCIS.  06/30/2013: Stereotactic-guided biopsy of right breast > fibrocystic changes and calcification. Recommendation screening mammogram in one year.      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 No pertinent family history. Social History   Occupational History   Occupation: Homemaker  Tobacco Use   Smoking status: Never   Smokeless tobacco: Never  Substance and Sexual Activity   Alcohol use: No   Drug use: No   Sexual activity: Yes    Partners: Male   Tobacco Counseling Counseling given: Not Answered  SDOH Screenings   Food Insecurity: No Food Insecurity (08/24/2024)  Housing: Low Risk  (08/24/2024)  Transportation Needs: No Transportation Needs (08/24/2024)  Utilities: Not At Risk (08/24/2024)  Alcohol Screen: Low Risk  (07/13/2023)  Depression (PHQ2-9): Low Risk  (08/24/2024)  Financial Resource Strain: Low Risk  (07/13/2023)  Physical Activity: Sufficiently Active (08/24/2024)  Social Connections: Socially Integrated (08/24/2024)  Stress: No Stress Concern Present (08/24/2024)  Tobacco Use: Low Risk  (08/24/2024)  Health Literacy: Adequate Health Literacy (08/24/2024)   See flowsheets for full screening details  Depression Screen PHQ 2 & 9 Depression Scale- Over the past 2 weeks, how often have you been bothered by any of the following problems? Little interest or pleasure in doing things: 0 Feeling down, depressed, or hopeless (PHQ Adolescent also includes...irritable): 0 PHQ-2 Total Score: 0 Trouble falling or staying asleep, or sleeping too much: 0 Feeling tired or having little energy: 0 Poor appetite or overeating (PHQ Adolescent also includes...weight loss): 0 Feeling bad about yourself - or that you are a failure or have let yourself or  your family down: 0 Trouble concentrating on things, such as reading the newspaper or watching television [facility] Adolescent also includes...like school work): 0 Moving  or speaking so slowly that other people could have noticed. Or the opposite - being so fidgety or restless that you have been moving around a lot more than usual: 0 Thoughts that you would be better off dead, or of hurting yourself in some way: 0 PHQ-9 Total Score: 0 If you checked off any problems, how difficult have these problems made it for you to do your work, take care of things at home, or get along with other people?: Not difficult at all  Depression Treatment Depression Interventions/Treatment : EYV7-0 Score <4 Follow-up Not Indicated     Goals Addressed             This Visit's Progress    08/24/24: To maintain, stay independent and active.         Visit info / Clinical Intake: Medicare Wellness Visit Type:: Subsequent Annual Wellness Visit Persons participating in visit:: patient Medicare Wellness Visit Mode:: Telephone If telephone:: video declined Because this visit was a virtual/telehealth visit:: pt reported vitals If Telephone or Video please confirm:: I connected with the patient using audio enabled telemedicine application and verified that I am speaking with the correct person using two identifiers; I discussed the limitations of evaluation and management by telemedicine; The patient expressed understanding and agreed to proceed Patient Location:: HOME Provider Location:: HOME OFFICE Information given by:: patient Interpreter Needed?: No Pre-visit prep was completed: yes AWV questionnaire completed by patient prior to visit?: no Living arrangements:: lives with spouse/significant other; with family/others Patient's Overall Health Status Rating: excellent Typical amount of pain: none Does pain affect daily life?: no Are you currently prescribed opioids?: no  Dietary Habits and Nutritional Risks How many meals a day?: 3 (SNACKS SOME & DRINKS PLENTY OF WATER) Eats fruit and vegetables daily?: yes Most meals are obtained by: preparing own meals; eating out In  the last 2 weeks, have you had any of the following?: none Diabetic:: no  Functional Status Activities of Daily Living (to include ambulation/medication): Independent Ambulation: Independent Medication Administration: Independent Home Management: Independent Manage your own finances?: yes Primary transportation is: driving Concerns about vision?: no *vision screening is required for WTM* Concerns about hearing?: no  Fall Screening Falls in the past year?: 0 Number of falls in past year: 0 Was there an injury with Fall?: 0 Fall Risk Category Calculator: 0 Patient Fall Risk Level: Low Fall Risk  Fall Risk Patient at Risk for Falls Due to: No Fall Risks Fall risk Follow up: Falls evaluation completed; Education provided  Home and Transportation Safety: All rugs have non-skid backing?: N/A, no rugs All stairs or steps have railings?: N/A, no stairs Grab bars in the bathtub or shower?: yes Have non-skid surface in bathtub or shower?: yes Good home lighting?: yes Regular seat belt use?: yes Hospital stays in the last year:: no  Cognitive Assessment Difficulty concentrating, remembering, or making decisions? : no Will 6CIT or Mini Cog be Completed: no 6CIT or Mini Cog Declined: patient alert, oriented, able to answer questions appropriately and recall recent events  Advance Directives (For Healthcare) Does Patient Have a Medical Advance Directive?: Yes Does patient want to make changes to medical advance directive?: No - Patient declined Type of Advance Directive: Healthcare Power of [place]; Living will Copy of Healthcare Power of Attorney in Chart?: No - copy requested Copy of Living Will in Chart?:  No - copy requested Would patient like information on creating a medical advance directive?: No - Patient declined  Reviewed/Updated  Reviewed/Updated: Reviewed All (Medical, Surgical, Family, Medications, Allergies, Care Teams, Patient Goals)        Objective:    Today's  Vitals   08/24/24 1413  Weight: 134 lb (60.8 kg)  Height: 5' 6 (1.676 m)  PainSc: 0-No pain   Body mass index is 21.63 kg/m.  Current Medications (verified) Outpatient Encounter Medications as of 08/24/2024  Medication Sig   amLODIPine -Valsartan -HCTZ 10-160-25 MG TABS TAKE 1 TABLET BY MOUTH EVERY DAY   atorvastatin  (LIPITOR) 20 MG tablet TAKE 1 TABLET BY MOUTH EVERY DAY   Biotin 5000 MCG TABS Take 5,000 mcg by mouth daily.   latanoprost (XALATAN) 0.005 % ophthalmic solution Place 1 drop into both eyes at bedtime.   metFORMIN  (GLUCOPHAGE -XR) 500 MG 24 hr tablet Take 3 tablets (1,500 mg total) by mouth daily with breakfast.   Multiple Vitamins-Minerals (CENTRUM SILVER ULTRA WOMENS PO) Take 1 tablet by mouth daily.   No facility-administered encounter medications on file as of 08/24/2024.   Hearing/Vision screen Hearing Screening - Comments:: Denies hearing difficulties.  Vision Screening - Comments:: Wears rx glasses - up to date with routine eye exams with Adine Haddock, MD.  Immunizations and Health Maintenance Health Maintenance  Topic Date Due   Zoster Vaccines- Shingrix (1 of 2) Never done   Colonoscopy  Never done   DEXA SCAN  Never done   Mammogram  06/08/2019   OPHTHALMOLOGY EXAM  07/14/2024   HEMOGLOBIN A1C  09/02/2024   COVID-19 Vaccine (7 - Pfizer risk 2025-26 season) 01/03/2025   Diabetic kidney evaluation - eGFR measurement  03/02/2025   Diabetic kidney evaluation - Urine ACR  03/02/2025   Medicare Annual Wellness (AWV)  08/24/2025   Pneumococcal Vaccine: 50+ Years  Completed   Influenza Vaccine  Completed   Hepatitis C Screening  Completed   Meningococcal B Vaccine  Aged Out   DTaP/Tdap/Td  Discontinued        Assessment/Plan:  This is a routine wellness examination for Kailoni.  Patient Care Team: McDiarmid, Krystal BIRCH, MD as PCP - General Haddock Adine, MD as Consulting Physician (Ophthalmology)  I have personally reviewed and noted the following in the  patient's chart:   Medical and social history Use of alcohol, tobacco or illicit drugs  Current medications and supplements including opioid prescriptions. Functional ability and status Nutritional status Physical activity Advanced directives List of other physicians Hospitalizations, surgeries, and ER visits in previous 12 months Vitals Screenings to include cognitive, depression, and falls Referrals and appointments  No orders of the defined types were placed in this encounter.  In addition, I have reviewed and discussed with patient certain preventive protocols, quality metrics, and best practice recommendations. A written personalized care plan for preventive services as well as general preventive health recommendations were provided to patient.   Roz LOISE Fuller, LPN   [id number]   Return in about 1 year (around 08/24/2025) for Medicare wellness.  After Visit Summary: (MyChart) Due to this being a telephonic visit, the after visit summary with patients personalized plan was offered to patient via MyChart   Nurse Notes: Patient aware of current care gaps.  Patient declined vaccines.  Patient has been scheduled for eye exam in February 2026. Patient declined screening mammogram. Patient aware to do self-breast check.
